# Patient Record
Sex: Female | Born: 1955 | Race: White | Hispanic: No | Smoking: Former smoker
Health system: Southern US, Community
[De-identification: ages and names within clinical notes are randomized; demographics above are authoritative.]

## PROBLEM LIST (undated history)

## (undated) DIAGNOSIS — Z8489 Family history of other specified conditions: Secondary | ICD-10-CM

## (undated) DIAGNOSIS — Z5189 Encounter for other specified aftercare: Secondary | ICD-10-CM

## (undated) DIAGNOSIS — I5181 Takotsubo syndrome: Secondary | ICD-10-CM

## (undated) DIAGNOSIS — F419 Anxiety disorder, unspecified: Secondary | ICD-10-CM

## (undated) DIAGNOSIS — G20A1 Parkinson's disease without dyskinesia, without mention of fluctuations: Secondary | ICD-10-CM

## (undated) DIAGNOSIS — E348 Other specified endocrine disorders: Secondary | ICD-10-CM

## (undated) DIAGNOSIS — R112 Nausea with vomiting, unspecified: Secondary | ICD-10-CM

## (undated) DIAGNOSIS — G20 Parkinson's disease: Secondary | ICD-10-CM

## (undated) DIAGNOSIS — K219 Gastro-esophageal reflux disease without esophagitis: Secondary | ICD-10-CM

## (undated) DIAGNOSIS — Z9889 Other specified postprocedural states: Secondary | ICD-10-CM

## (undated) DIAGNOSIS — J45909 Unspecified asthma, uncomplicated: Secondary | ICD-10-CM

## (undated) DIAGNOSIS — R0600 Dyspnea, unspecified: Secondary | ICD-10-CM

## (undated) DIAGNOSIS — R7303 Prediabetes: Secondary | ICD-10-CM

## (undated) DIAGNOSIS — M1990 Unspecified osteoarthritis, unspecified site: Secondary | ICD-10-CM

## (undated) DIAGNOSIS — T782XXA Anaphylactic shock, unspecified, initial encounter: Secondary | ICD-10-CM

## (undated) HISTORY — PX: BREAST SURGERY: SHX581

## (undated) HISTORY — PX: BUNIONECTOMY: SHX129

## (undated) HISTORY — PX: ABDOMINAL HYSTERECTOMY: SHX81

## (undated) HISTORY — PX: OTHER SURGICAL HISTORY: SHX169

## (undated) HISTORY — PX: APPENDECTOMY: SHX54

## (undated) HISTORY — PX: CHOLECYSTECTOMY: SHX55

---

## 2017-11-12 ENCOUNTER — Other Ambulatory Visit: Payer: Self-pay | Admitting: Family Medicine

## 2017-11-12 DIAGNOSIS — M25462 Effusion, left knee: Secondary | ICD-10-CM

## 2017-11-12 DIAGNOSIS — M25562 Pain in left knee: Secondary | ICD-10-CM

## 2017-11-12 DIAGNOSIS — M25662 Stiffness of left knee, not elsewhere classified: Secondary | ICD-10-CM

## 2017-11-13 ENCOUNTER — Ambulatory Visit
Admission: RE | Admit: 2017-11-13 | Discharge: 2017-11-13 | Disposition: A | Discharge status: Home / Self Care | Payer: Medicaid Other | Source: Ambulatory Visit | Attending: Family Medicine | Admitting: Family Medicine

## 2017-11-13 DIAGNOSIS — M25562 Pain in left knee: Secondary | ICD-10-CM

## 2017-11-13 DIAGNOSIS — M25462 Effusion, left knee: Secondary | ICD-10-CM

## 2017-11-13 DIAGNOSIS — M25662 Stiffness of left knee, not elsewhere classified: Secondary | ICD-10-CM

## 2017-11-23 DIAGNOSIS — E348 Other specified endocrine disorders: Secondary | ICD-10-CM | POA: Insufficient documentation

## 2017-11-23 DIAGNOSIS — R296 Repeated falls: Secondary | ICD-10-CM | POA: Insufficient documentation

## 2017-11-23 DIAGNOSIS — G20 Parkinson's disease: Secondary | ICD-10-CM | POA: Insufficient documentation

## 2017-11-23 DIAGNOSIS — E119 Type 2 diabetes mellitus without complications: Secondary | ICD-10-CM | POA: Insufficient documentation

## 2017-11-27 ENCOUNTER — Other Ambulatory Visit: Payer: Self-pay | Admitting: Neurology

## 2017-11-27 DIAGNOSIS — E348 Other specified endocrine disorders: Secondary | ICD-10-CM

## 2017-11-27 DIAGNOSIS — R296 Repeated falls: Secondary | ICD-10-CM

## 2017-11-27 DIAGNOSIS — G231 Progressive supranuclear ophthalmoplegia [Steele-Richardson-Olszewski]: Secondary | ICD-10-CM

## 2017-12-06 ENCOUNTER — Other Ambulatory Visit: Payer: Self-pay

## 2017-12-06 ENCOUNTER — Encounter: Payer: Self-pay | Admitting: Radiology

## 2017-12-06 ENCOUNTER — Inpatient Hospital Stay (HOSPITAL_COMMUNITY)
Admission: EM | Admit: 2017-12-06 | Discharge: 2017-12-08 | DRG: 287 | Disposition: A | Discharge status: Home / Self Care | Payer: Medicaid Other | Attending: Cardiology | Admitting: Cardiology

## 2017-12-06 ENCOUNTER — Ambulatory Visit
Admission: RE | Admit: 2017-12-06 | Discharge: 2017-12-06 | Disposition: A | Discharge status: Home / Self Care | Payer: Medicaid Other | Source: Ambulatory Visit | Attending: Neurology | Admitting: Neurology

## 2017-12-06 ENCOUNTER — Inpatient Hospital Stay (HOSPITAL_COMMUNITY)
Admission: EM | Disposition: A | Discharge status: Home / Self Care | Payer: Self-pay | Source: Home / Self Care | Attending: Cardiology

## 2017-12-06 DIAGNOSIS — Z87891 Personal history of nicotine dependence: Secondary | ICD-10-CM

## 2017-12-06 DIAGNOSIS — T886XXA Anaphylactic reaction due to adverse effect of correct drug or medicament properly administered, initial encounter: Secondary | ICD-10-CM | POA: Diagnosis present

## 2017-12-06 DIAGNOSIS — G231 Progressive supranuclear ophthalmoplegia [Steele-Richardson-Olszewski]: Secondary | ICD-10-CM | POA: Diagnosis present

## 2017-12-06 DIAGNOSIS — Z91040 Latex allergy status: Secondary | ICD-10-CM

## 2017-12-06 DIAGNOSIS — Z8249 Family history of ischemic heart disease and other diseases of the circulatory system: Secondary | ICD-10-CM

## 2017-12-06 DIAGNOSIS — G20 Parkinson's disease: Secondary | ICD-10-CM | POA: Diagnosis present

## 2017-12-06 DIAGNOSIS — G930 Cerebral cysts: Secondary | ICD-10-CM | POA: Diagnosis present

## 2017-12-06 DIAGNOSIS — I5181 Takotsubo syndrome: Secondary | ICD-10-CM | POA: Diagnosis not present

## 2017-12-06 DIAGNOSIS — Z9181 History of falling: Secondary | ICD-10-CM

## 2017-12-06 DIAGNOSIS — Z9049 Acquired absence of other specified parts of digestive tract: Secondary | ICD-10-CM

## 2017-12-06 DIAGNOSIS — Y838 Other surgical procedures as the cause of abnormal reaction of the patient, or of later complication, without mention of misadventure at the time of the procedure: Secondary | ICD-10-CM | POA: Diagnosis present

## 2017-12-06 DIAGNOSIS — I214 Non-ST elevation (NSTEMI) myocardial infarction: Secondary | ICD-10-CM

## 2017-12-06 DIAGNOSIS — R7989 Other specified abnormal findings of blood chemistry: Secondary | ICD-10-CM

## 2017-12-06 DIAGNOSIS — R748 Abnormal levels of other serum enzymes: Secondary | ICD-10-CM | POA: Diagnosis not present

## 2017-12-06 DIAGNOSIS — I252 Old myocardial infarction: Secondary | ICD-10-CM

## 2017-12-06 DIAGNOSIS — Z91041 Radiographic dye allergy status: Secondary | ICD-10-CM

## 2017-12-06 DIAGNOSIS — E348 Other specified endocrine disorders: Secondary | ICD-10-CM

## 2017-12-06 DIAGNOSIS — T508X5A Adverse effect of diagnostic agents, initial encounter: Secondary | ICD-10-CM | POA: Diagnosis present

## 2017-12-06 DIAGNOSIS — R778 Other specified abnormalities of plasma proteins: Secondary | ICD-10-CM

## 2017-12-06 DIAGNOSIS — R072 Precordial pain: Secondary | ICD-10-CM

## 2017-12-06 DIAGNOSIS — R296 Repeated falls: Secondary | ICD-10-CM

## 2017-12-06 DIAGNOSIS — I200 Unstable angina: Secondary | ICD-10-CM

## 2017-12-06 DIAGNOSIS — T782XXA Anaphylactic shock, unspecified, initial encounter: Secondary | ICD-10-CM

## 2017-12-06 DIAGNOSIS — K219 Gastro-esophageal reflux disease without esophagitis: Secondary | ICD-10-CM | POA: Diagnosis present

## 2017-12-06 DIAGNOSIS — R079 Chest pain, unspecified: Secondary | ICD-10-CM

## 2017-12-06 DIAGNOSIS — Z90710 Acquired absence of both cervix and uterus: Secondary | ICD-10-CM

## 2017-12-06 DIAGNOSIS — E119 Type 2 diabetes mellitus without complications: Secondary | ICD-10-CM | POA: Diagnosis present

## 2017-12-06 HISTORY — DX: Parkinson's disease without dyskinesia, without mention of fluctuations: G20.A1

## 2017-12-06 HISTORY — PX: LEFT HEART CATH AND CORONARY ANGIOGRAPHY: CATH118249

## 2017-12-06 HISTORY — DX: Anaphylactic shock, unspecified, initial encounter: T78.2XXA

## 2017-12-06 HISTORY — DX: Encounter for other specified aftercare: Z51.89

## 2017-12-06 HISTORY — DX: Other specified endocrine disorders: E34.8

## 2017-12-06 HISTORY — DX: Parkinson's disease: G20

## 2017-12-06 HISTORY — DX: Takotsubo syndrome: I51.81

## 2017-12-06 LAB — CBC WITH DIFFERENTIAL/PLATELET
Abs Immature Granulocytes: 0.1 10*3/uL (ref 0.0–0.1)
Abs Immature Granulocytes: 0 10*3/uL (ref 0.0–0.1)
BASOS ABS: 0 10*3/uL (ref 0.0–0.1)
BASOS PCT: 0 %
Basophils Absolute: 0 10*3/uL (ref 0.0–0.1)
Basophils Relative: 0 %
EOS ABS: 0 10*3/uL (ref 0.0–0.7)
EOS PCT: 0 %
Eosinophils Absolute: 0 10*3/uL (ref 0.0–0.7)
Eosinophils Relative: 0 %
HCT: 41.5 % (ref 36.0–46.0)
HCT: 41 % (ref 36.0–46.0)
Hemoglobin: 13.1 g/dL (ref 12.0–15.0)
Hemoglobin: 13.1 g/dL (ref 12.0–15.0)
Immature Granulocytes: 0 %
Immature Granulocytes: 0 %
Lymphocytes Relative: 7 %
Lymphocytes Relative: 9 %
Lymphs Abs: 0.8 10*3/uL (ref 0.7–4.0)
Lymphs Abs: 0.7 10*3/uL (ref 0.7–4.0)
MCH: 29 pg (ref 26.0–34.0)
MCH: 29 pg (ref 26.0–34.0)
MCHC: 31.6 g/dL (ref 30.0–36.0)
MCHC: 32 g/dL (ref 30.0–36.0)
MCV: 92 fL (ref 78.0–100.0)
MCV: 90.9 fL (ref 78.0–100.0)
Monocytes Absolute: 0.3 10*3/uL (ref 0.1–1.0)
Monocytes Absolute: 0.1 10*3/uL (ref 0.1–1.0)
Monocytes Relative: 3 %
Monocytes Relative: 1 %
NEUTROS ABS: 10.2 10*3/uL — AB (ref 1.7–7.7)
Neutro Abs: 7 10*3/uL (ref 1.7–7.7)
Neutrophils Relative %: 90 %
Neutrophils Relative %: 90 %
PLATELETS: 212 10*3/uL (ref 150–400)
Platelets: 228 10*3/uL (ref 150–400)
RBC: 4.51 MIL/uL (ref 3.87–5.11)
RBC: 4.51 MIL/uL (ref 3.87–5.11)
RDW: 12.8 % (ref 11.5–15.5)
RDW: 12.9 % (ref 11.5–15.5)
WBC: 11.3 10*3/uL — ABNORMAL HIGH (ref 4.0–10.5)
WBC: 7.9 10*3/uL (ref 4.0–10.5)

## 2017-12-06 LAB — BASIC METABOLIC PANEL
ANION GAP: 10 (ref 5–15)
BUN: 14 mg/dL (ref 8–23)
CALCIUM: 9.4 mg/dL (ref 8.9–10.3)
CO2: 26 mmol/L (ref 22–32)
CREATININE: 0.72 mg/dL (ref 0.44–1.00)
Chloride: 105 mmol/L (ref 98–111)
GFR calc Af Amer: >60 mL/min (ref >60)
Glucose, Bld: 174 mg/dL — ABNORMAL HIGH (ref 70–99)
POTASSIUM: 4.1 mmol/L (ref 3.5–5.1)
Sodium: 141 mmol/L (ref 135–145)

## 2017-12-06 LAB — GLUCOSE, CAPILLARY: Glucose-Capillary: 196 mg/dL — ABNORMAL HIGH (ref 70–99)

## 2017-12-06 LAB — I-STAT TROPONIN, ED: TROPONIN I, POC: 1.38 ng/mL — AB (ref 0.00–0.08)

## 2017-12-06 LAB — TSH: TSH: 0.589 u[IU]/mL (ref 0.350–4.500)

## 2017-12-06 LAB — TROPONIN I: TROPONIN I: 2.07 ng/mL — AB (ref <0.03)

## 2017-12-06 LAB — HEMOGLOBIN A1C
Hgb A1c MFr Bld: 6.6 % — ABNORMAL HIGH (ref 4.8–5.6)
Mean Plasma Glucose: 142.72 mg/dL

## 2017-12-06 LAB — PROTIME-INR
INR: 0.99
Prothrombin Time: 13 seconds (ref 11.4–15.2)

## 2017-12-06 LAB — CREATININE, SERUM
Creatinine, Ser: 0.69 mg/dL (ref 0.44–1.00)
GFR calc Af Amer: >60 mL/min
GFR calc non Af Amer: >60 mL/min

## 2017-12-06 SURGERY — LEFT HEART CATH AND CORONARY ANGIOGRAPHY
Anesthesia: LOCAL

## 2017-12-06 MED ORDER — GADOBENATE DIMEGLUMINE 529 MG/ML IV SOLN
17.0000 mL | Freq: Once | INTRAVENOUS | Status: AC | PRN
  Administered 2017-12-06: 17 mL via INTRAVENOUS

## 2017-12-06 MED ORDER — METHYLPREDNISOLONE SODIUM SUCC 125 MG IJ SOLR
125.0000 mg | Freq: Once | INTRAMUSCULAR | Status: AC
  Administered 2017-12-06: 125 mg via INTRAVENOUS
  Filled 2017-12-06: qty 2

## 2017-12-06 MED ORDER — HEPARIN (PORCINE) IN NACL 1000-0.9 UT/500ML-% IV SOLN
INTRAVENOUS | Status: AC
  Filled 2017-12-06: qty 1000

## 2017-12-06 MED ORDER — ACETAMINOPHEN 325 MG PO TABS: 650.0000 mg | ORAL_TABLET | ORAL | Status: DC | PRN

## 2017-12-06 MED ORDER — IOPAMIDOL (ISOVUE-370) INJECTION 76%
INTRAVENOUS | Status: AC
  Filled 2017-12-06: qty 100

## 2017-12-06 MED ORDER — SODIUM CHLORIDE 0.9 % IV SOLN
INTRAVENOUS | Status: AC | PRN
  Administered 2017-12-06: 10 mL/h via INTRAVENOUS

## 2017-12-06 MED ORDER — IPRATROPIUM-ALBUTEROL 0.5-2.5 (3) MG/3ML IN SOLN
RESPIRATORY_TRACT | Status: AC
  Filled 2017-12-06: qty 3

## 2017-12-06 MED ORDER — ACETAMINOPHEN 325 MG PO TABS
650.0000 mg | ORAL_TABLET | ORAL | Status: DC | PRN
  Administered 2017-12-07 – 2017-12-08 (×4): 650 mg via ORAL
  Filled 2017-12-06 (×4): qty 2

## 2017-12-06 MED ORDER — FAMOTIDINE IN NACL 20-0.9 MG/50ML-% IV SOLN
20.0000 mg | Freq: Once | INTRAVENOUS | Status: AC
  Administered 2017-12-06: 20 mg via INTRAVENOUS
  Filled 2017-12-06: qty 50

## 2017-12-06 MED ORDER — MIDAZOLAM HCL 2 MG/2ML IJ SOLN
INTRAMUSCULAR | Status: DC | PRN
  Administered 2017-12-06: 0.5 mg via INTRAVENOUS

## 2017-12-06 MED ORDER — FENTANYL CITRATE (PF) 100 MCG/2ML IJ SOLN
INTRAMUSCULAR | Status: AC
  Filled 2017-12-06: qty 2

## 2017-12-06 MED ORDER — ACETAMINOPHEN 500 MG PO TABS
1000.0000 mg | ORAL_TABLET | Freq: Once | ORAL | Status: AC
  Administered 2017-12-06: 1000 mg via ORAL
  Filled 2017-12-06: qty 2

## 2017-12-06 MED ORDER — ONDANSETRON HCL 4 MG/2ML IJ SOLN: 4.0000 mg | Freq: Four times a day (QID) | INTRAMUSCULAR | Status: DC | PRN

## 2017-12-06 MED ORDER — ASPIRIN EC 81 MG PO TBEC
81.0000 mg | DELAYED_RELEASE_TABLET | Freq: Every day | ORAL | Status: DC
  Administered 2017-12-07 – 2017-12-08 (×2): 81 mg via ORAL
  Filled 2017-12-06 (×2): qty 1

## 2017-12-06 MED ORDER — HEPARIN SODIUM (PORCINE) 5000 UNIT/ML IJ SOLN
5000.0000 [IU] | Freq: Three times a day (TID) | INTRAMUSCULAR | Status: DC
  Administered 2017-12-06 – 2017-12-08 (×5): 5000 [IU] via SUBCUTANEOUS
  Filled 2017-12-06 (×5): qty 1

## 2017-12-06 MED ORDER — FUROSEMIDE 10 MG/ML IJ SOLN
INTRAMUSCULAR | Status: DC | PRN
  Administered 2017-12-06: 20 mg via INTRAVENOUS

## 2017-12-06 MED ORDER — ASPIRIN 325 MG PO TABS: 325.0000 mg | ORAL_TABLET | Freq: Once | ORAL | Status: DC

## 2017-12-06 MED ORDER — VERAPAMIL HCL 2.5 MG/ML IV SOLN
INTRAVENOUS | Status: AC
  Filled 2017-12-06: qty 2

## 2017-12-06 MED ORDER — FUROSEMIDE 10 MG/ML IJ SOLN
INTRAMUSCULAR | Status: AC
  Filled 2017-12-06: qty 4

## 2017-12-06 MED ORDER — SODIUM CHLORIDE 0.9 % IV SOLN
INTRAVENOUS | Status: AC
  Administered 2017-12-06: 18:00:00 via INTRAVENOUS

## 2017-12-06 MED ORDER — HEPARIN SODIUM (PORCINE) 1000 UNIT/ML IJ SOLN
INTRAMUSCULAR | Status: AC
  Filled 2017-12-06: qty 1

## 2017-12-06 MED ORDER — SODIUM CHLORIDE 0.9 % IV BOLUS
500.0000 mL | Freq: Once | INTRAVENOUS | Status: AC
  Administered 2017-12-06: 11:00:00 via INTRAVENOUS

## 2017-12-06 MED ORDER — LIDOCAINE HCL (PF) 1 % IJ SOLN
INTRAMUSCULAR | Status: AC
  Filled 2017-12-06: qty 30

## 2017-12-06 MED ORDER — VERAPAMIL HCL 2.5 MG/ML IV SOLN
INTRAVENOUS | Status: DC | PRN
  Administered 2017-12-06: 10 mL via INTRA_ARTERIAL

## 2017-12-06 MED ORDER — SODIUM CHLORIDE 0.9% FLUSH
3.0000 mL | Freq: Two times a day (BID) | INTRAVENOUS | Status: DC
  Administered 2017-12-06 – 2017-12-08 (×4): 3 mL via INTRAVENOUS

## 2017-12-06 MED ORDER — IOPAMIDOL (ISOVUE-370) INJECTION 76%
INTRAVENOUS | Status: DC | PRN
  Administered 2017-12-06: 55 mL via INTRA_ARTERIAL

## 2017-12-06 MED ORDER — HEPARIN (PORCINE) IN NACL 2-0.9 UNITS/ML
INTRAMUSCULAR | Status: AC | PRN
  Administered 2017-12-06 (×2): 500 mL

## 2017-12-06 MED ORDER — SODIUM CHLORIDE 0.9% FLUSH: 3.0000 mL | INTRAVENOUS | Status: DC | PRN

## 2017-12-06 MED ORDER — LIDOCAINE HCL (PF) 1 % IJ SOLN
INTRAMUSCULAR | Status: DC | PRN
  Administered 2017-12-06: 2 mL via SUBCUTANEOUS

## 2017-12-06 MED ORDER — NITROGLYCERIN 0.4 MG SL SUBL: 0.4000 mg | SUBLINGUAL_TABLET | SUBLINGUAL | Status: DC | PRN

## 2017-12-06 MED ORDER — MIDAZOLAM HCL 2 MG/2ML IJ SOLN
INTRAMUSCULAR | Status: AC
  Filled 2017-12-06: qty 2

## 2017-12-06 MED ORDER — DIPHENHYDRAMINE HCL 50 MG/ML IJ SOLN
25.0000 mg | Freq: Once | INTRAMUSCULAR | Status: AC
  Administered 2017-12-06: 25 mg via INTRAVENOUS
  Filled 2017-12-06: qty 1

## 2017-12-06 MED ORDER — MORPHINE SULFATE (PF) 10 MG/ML IV SOLN
2.0000 mg | INTRAVENOUS | Status: DC | PRN
  Administered 2017-12-06: 2 mg via INTRAVENOUS

## 2017-12-06 MED ORDER — MORPHINE SULFATE (PF) 4 MG/ML IV SOLN
INTRAVENOUS | Status: AC
  Filled 2017-12-06: qty 1

## 2017-12-06 MED ORDER — SODIUM CHLORIDE 0.9 % IV SOLN: 250.0000 mL | INTRAVENOUS | Status: DC | PRN

## 2017-12-06 MED ORDER — FENTANYL CITRATE (PF) 100 MCG/2ML IJ SOLN
INTRAMUSCULAR | Status: DC | PRN
  Administered 2017-12-06: 25 ug via INTRAVENOUS

## 2017-12-06 MED ORDER — MORPHINE SULFATE (PF) 2 MG/ML IV SOLN
2.0000 mg | INTRAVENOUS | Status: DC | PRN
  Administered 2017-12-06 – 2017-12-07 (×7): 2 mg via INTRAVENOUS
  Filled 2017-12-06 (×7): qty 1

## 2017-12-06 SURGICAL SUPPLY — 9 items
CATH 5FR JL3.5 JR4 ANG PIG MP (CATHETERS) ×2 IMPLANT
DEVICE RAD COMP TR BAND LRG (VASCULAR PRODUCTS) ×2 IMPLANT
GLIDESHEATH SLEND A-KIT 6F 22G (SHEATH) ×2 IMPLANT
GUIDEWIRE INQWIRE 1.5J.035X260 (WIRE) ×1 IMPLANT
INQWIRE 1.5J .035X260CM (WIRE) ×2
KIT HEART LEFT (KITS) ×2 IMPLANT
PACK CARDIAC CATHETERIZATION (CUSTOM PROCEDURE TRAY) ×2 IMPLANT
TRANSDUCER W/STOPCOCK (MISCELLANEOUS) ×2 IMPLANT
TUBING CIL FLEX 10 FLL-RA (TUBING) ×2 IMPLANT

## 2017-12-06 NOTE — Progress Notes (Signed)
Magda Kiel. Young, RN placed purewick on pt. and placed to suction.

## 2017-12-06 NOTE — CV Procedure (Signed)
   Urgent left heart with coronary angiography performed after the patient developed chest pain following administration of contrast media for MRI.  Severe anterior apical and inferoapical hypokinesis/akinesis on left ventricular hand injection.  Elevated LVEDP beyond 30 mmHg.  Widely patent coronaries with 40-50% mid LAD before the first septal perforator.  LAD in the segment could be intramyocardial.  Normal RCA, left main, LAD and diagonals, and circumflex.

## 2017-12-06 NOTE — Progress Notes (Signed)
Came to room for breathing tx, per RN she already gave treatment as a stat dose.

## 2017-12-06 NOTE — ED Notes (Signed)
Radiolucent Zoll pads applied. RN with patient to cath lab at this time.

## 2017-12-06 NOTE — ED Triage Notes (Signed)
Per [facility] patient coming from doctors office after having allergic reaction occurred after receiving IV contrast. Patient began having SOB, chest pain, lip and throat swelling. Patient given 50 mg benadryl and 0.3 mg epi x2, due neb, and 324 aspirin. Chest pain is left sided and radiates to left side of neck. Hx. Of parkinson's. A/ox4.

## 2017-12-06 NOTE — Progress Notes (Signed)
Summary of events: Received patient in nursing station at [facility] site from MR 3 after patient began having an allergic reaction after Multihance/Gadolinium was injected IV.  She was sitting in her wheelchair with swollen lips and eyes, flushed face and upper body, and a wet cough (laryngeal edema).  Patient received Benadryl 50mg  IV and Epinephrine 1mg  IV before EMS arrived and took over and transported [facility].  Her blood pressures ranged from 190/117- 118/85, with her heart rate 112-153 in a sinus tach without ectopy. Normal Saline 250cc bag hung; infusing slowly through existing 22g IV catheter in right AC space. Initial SPO2 was 94-95 before O2 was administered via [place] at 4L after IV Benadryl made patient sleepy .  O2 delivery was switched to face mask since patient was mouth-breathing and had O2 sats in low 90's. Dr. Alfredo BattyMattern spoke with patient's daughter once she arrived.  She collected all of her mother's belongings and [facility].  Donell SievertJeanne Montarius Kitagawa, RN

## 2017-12-06 NOTE — ED Notes (Signed)
Pt called out, reports feeling tight in chest and felt like she was wheezing, on exam right upper lungs with wheezing. PA Ford made aware and given verbal order for x1 duoneb. Orders followed accordingly.

## 2017-12-06 NOTE — ED Notes (Signed)
Pt taking own parkinsons meds per PA

## 2017-12-06 NOTE — ED Provider Notes (Signed)
Medical screening examination/treatment/procedure(s) were conducted as a shared visit with non-physician practitioner(s) and myself.  I personally evaluated the patient during the encounter.  EKG Interpretation  Date/Time:  Thursday December 06 2017 10:50:10 EDT Ventricular Rate:  104 PR Interval:    QRS Duration: 77 QT Interval:  339 QTC Calculation: 446 R Axis:   27 Text Interpretation:  Sinus tachycardia Low voltage, precordial leads Abnormal R-wave progression, early transition No previous ECGs available Confirmed by Vanetta Mulders [phone]) on 12/06/2017 11:03:09 AM  Results for orders placed or performed during the hospital encounter of 12/06/17  Basic metabolic panel  Result Value Ref Range   Sodium 141 135 - 145 mmol/L   Potassium 4.1 3.5 - 5.1 mmol/L   Chloride 105 98 - 111 mmol/L   CO2 26 22 - 32 mmol/L   Glucose, Bld 174 (H) 70 - 99 mg/dL   BUN 14 8 - 23 mg/dL   Creatinine, Ser 9.14 0.44 - 1.00 mg/dL   Calcium 9.4 8.9 - 78.2 mg/dL   GFR calc non Af Amer >60 >60 mL/min   GFR calc Af Amer >60 >60 mL/min   Anion gap 10 5 - 15  CBC with Differential  Result Value Ref Range   WBC 11.3 (H) 4.0 - 10.5 K/uL   RBC 4.51 3.87 - 5.11 MIL/uL   Hemoglobin 13.1 12.0 - 15.0 g/dL   HCT 95.6 21.3 - 08.6 %   MCV 92.0 78.0 - 100.0 fL   MCH 29.0 26.0 - 34.0 pg   MCHC 31.6 30.0 - 36.0 g/dL   RDW 57.8 46.9 - 62.9 %   Platelets 212 150 - 400 K/uL   Neutrophils Relative % 90 %   Neutro Abs 10.2 (H) 1.7 - 7.7 K/uL   Lymphocytes Relative 7 %   Lymphs Abs 0.8 0.7 - 4.0 K/uL   Monocytes Relative 3 %   Monocytes Absolute 0.3 0.1 - 1.0 K/uL   Eosinophils Relative 0 %   Eosinophils Absolute 0.0 0.0 - 0.7 K/uL   Basophils Relative 0 %   Basophils Absolute 0.0 0.0 - 0.1 K/uL   Immature Granulocytes 0 %   Abs Immature Granulocytes 0.1 0.0 - 0.1 K/uL  I-stat troponin, ED  Result Value Ref Range   Troponin i, poc 1.38 (HH) 0.00 - 0.08 ng/mL   Comment NOTIFIED PHYSICIAN    Comment 3              Patient seen by me along with the physician assistant.  Patient was sent over from the [facility] imaging center which we initially thought was for an allergic reaction to contrast dye.  And that was part of it.  Resulted in as if she was wheezy and also some lip swelling.  However due to the severity of the reaction nare patient did receive epinephrine.  We were notified by radiology that the dose of epinephrine was not of an EpiPen.  Patient received 1 mg of epinephrine x2.  Then went on to develop some chest pain.  This obviously is of some concern.  Patient's troponin here is elevated EKG without any acute cardiac changes nothing consistent for STEMI.  However based on this story patient's more than just an allergic reaction which we could have observed and if she improved  Send her home feel that would obviously the elevated troponin the chest pain which may be post cardiac stress related does warrant admission and serial troponins.  Currently patient stable oxygen saturation on oxygen are in  the upper 90s patient feels more comfortable on oxygen still a little tachycardic with heart rate of 103.  Blood pressures fine no hypotension.  Patient able to talk okay lips are still swollen patient feels tongue is not swollen patient handling her upper airway secretions fine.  Patient does not need intubation at this time.  Lungs are clear bilaterally no wheezing.   CRITICAL CARE Performed by: Vanetta MuldersZACKOWSKI,Jermiah Howton Total critical care time: 30 minutes Critical care time was exclusive of separately billable procedures and treating other patients. Critical care was necessary to treat or prevent imminent or life-threatening deterioration. Critical care was time spent personally by me on the following activities: development of treatment plan with patient and/or surrogate as well as nursing, discussions with consultants, evaluation of patient's response to treatment, examination of patient, obtaining  history from patient or surrogate, ordering and performing treatments and interventions, ordering and review of laboratory studies, ordering and review of radiographic studies, pulse oximetry and re-evaluation of patient's condition.    Vanetta MuldersZackowski, Margrit Minner, MD 12/06/17 [phone]

## 2017-12-06 NOTE — H&P (Addendum)
History and Physical:   Patient IDFanny Bien: Syble Tenpenny; [id number]; 02/18/1956   Admit date: 12/06/2017 Date of Consult: 12/06/2017  Primary Care Provider: Leilani Ableeese, Betti, MD Primary Cardiologist: New-Dr. Jens Somrenshaw CC: Anaphylaxis reaction   Patient Profile:   Fanny BienSusanne Barefoot is a 62 y.o. female with a hx of DM2, Parkinson's and pineal gland cyst who is being seen today for the evaluation of elevated troponin at the request of Dr. Ala DachFord.  History of Present Illness:   Ms. Azucena CecilBuffkin is a 62yo F with a hx as stated above who [facility] after having an anaphylaxis allergic reaction to the gadolinium contrast dye while getting an outpatient MRI at [facility] earlier today to further evaluate the pineal cyst. Pt states that shortly after dye injection she began to experience lip and facial swelling  with complaints of throat closing and felling as if a "balloon" was in her throat. Per chart review, the facility did not have EPI Pen dosing for epinephrine injection. It seems that she was given 1mg  doses of epinephrine, far more than one would typically receive in an uncomplicated anaphylaxis rescue. She then began to experience anterior chest pain with radiation to her left shoulder. EMS was called and she was transported to the ED for further evaluation.   In the ED, she was given 50 mg of benadryl and an additional 0.3 mg of Epinephrine, as well as Duoneb for wheezing and 324 ASA. Her chest pain, described as left sided chest pain with radiation to her left neck, has persisted since her arrival. She states that the pain in continual, described as sharp and stabbing rated a 7/10 in severity. An EKG revealed sinus tachycardia with a HR of 104 bpm with low voltage QRS and baseline wonder. There is no evidence of acute ischemia with ST-T wave abnormalities. Given her persistent symptoms, a troponin was drawn which was elevated at 1.38.   She has no prior hx of CAD. She has a hx of  smoking, quit in 2000. She drinks alcohol only on occasion. She has a family hx of CHF in her mother and father, but no hx of CAD. Daughter is at the bedside who reports patient just recently moved here from [place] to become established with [facility] for better management of her Parkinson's and was referred for the imaging for better evaluation of the pineal cyst. She reports baseline SOB and some recent LE swelling. She walks with a cane and walker secondary to her Parkinson's. She denies orthopnea, recent illness, N/V, diaphoresis, or syncope. Prior to this event, she states that she was in her usual state of health.   Cardiology has been asked to see and admit the patient secondary to markedly elevated troponin level.   Past Medical History:  Diagnosis Date  . Blood transfusion without reported diagnosis   . Parkinson's disease [facility](HCC)     Past Surgical History:  Procedure Laterality Date  . ABDOMINAL HYSTERECTOMY    . APPENDECTOMY    . BREAST SURGERY    . CHOLECYSTECTOMY       Prior to Admission medications   Not on File    Inpatient Medications: Scheduled Meds: . acetaminophen  1,000 mg Oral Once  . aspirin  325 mg Oral Once  . ipratropium-albuterol       Continuous Infusions:  PRN Meds:   Allergies:    Allergies  Allergen Reactions  . Gadolinium Derivatives Anaphylaxis    12/06/17 After receiving Multihance: severe lip  swelling, redness, laryngeal edema; to ED via EMS  . Latex Anaphylaxis    Social History:   Social History   Socioeconomic History  . Marital status: Divorced    Spouse name: Not on file  . Number of children: Not on file  . Years of education: Not on file  . Highest education level: Not on file  Occupational History  . Not on file  Social Needs  . Financial resource strain: Not on file  . Food insecurity:    Worry: Not on file    Inability: Not on file  . Transportation needs:    Medical: Not on file    Non-medical: Not on  file  Tobacco Use  . Smoking status: Never Smoker  . Smokeless tobacco: Never Used  Substance and Sexual Activity  . Alcohol use: Yes    Comment: occasionally   . Drug use: Never  . Sexual activity: Not on file  Lifestyle  . Physical activity:    Days per week: Not on file    Minutes per session: Not on file  . Stress: Not on file  Relationships  . Social connections:    Talks on phone: Not on file    Gets together: Not on file    Attends religious service: Not on file    Active member of club or organization: Not on file    Attends meetings of clubs or organizations: Not on file    Relationship status: Not on file  . Intimate partner violence:    Fear of current or ex partner: Not on file    Emotionally abused: Not on file    Physically abused: Not on file    Forced sexual activity: Not on file  Other Topics Concern  . Not on file  Social History Narrative  . Not on file    Family History:   No family history on file. Family Status:  No family status information on file.    ROS:  Please see the history of present illness.  All other ROS reviewed and negative.     Physical Exam/Data:   Vitals:   12/06/17 1330 12/06/17 1345 12/06/17 1400 12/06/17 1415  BP: 110/73 113/82 107/74 (!) 118/97  Pulse: (!) 108 (!) 107 (!) 105 (!) 105  Resp: 15 (!) 21 15 (!) 22  Temp:      TempSrc:      SpO2: 97% 96% 98% 97%  Weight:      Height:       No intake or output data in the 24 hours ending 12/06/17 1428 Filed Weights   12/06/17 1049  Weight: 185 lb (83.9 kg)   Body mass index is 28.13 kg/m.   General: Obese, NAD Skin: Warm, dry, intact. There is evidence of facial swelling.  Head: Normocephalic, atraumatic, clear, moist mucus membranes. Neck: Negative for carotid bruits. No JVD Lungs: Bilateral upper lobe wheezing.  No rales or rhonchi. Breathing is unlabored on [place]  Cardiovascular: RRR with S1 S2. No murmurs, rubs or gallops Abdomen: Soft, non-tender,  non-distended with normoactive bowel sounds.  No obvious abdominal masses. MSK: Strength and tone appear normal for age. 5/5 in all extremities Extremities: Mild 1+ LE edema. No clubbing or cyanosis. DP/PT pulses 2+ bilaterally Neuro: Alert and oriented. No focal deficits. No facial asymmetry. MAE spontaneously. Psych: Responds to questions appropriately with normal affect.     EKG:  The EKG was personally reviewed and demonstrates:  12/06/17 ST with no acute ischemic changes  Telemetry:  Telemetry was personally reviewed and demonstrates: 12/06/17 ST HR 104  Relevant CV Studies:  ECHO: None   CATH: None   Laboratory Data:  ChemistryNo results for input(s): NA, K, CL, CO2, GLUCOSE, BUN, CREATININE, CALCIUM, GFRNONAA, GFRAA, ANIONGAP in the last 168 hours.  No results found for: PROT, ALBUMIN, AST, ALT, ALKPHOS, BILITOT Hematology Recent Labs  Lab 12/06/17 1350  WBC 11.3*  RBC 4.51  HGB 13.1  HCT 41.5  MCV 92.0  MCH 29.0  MCHC 31.6  RDW 12.8  PLT 212   Cardiac EnzymesNo results for input(s): TROPONINI in the last 168 hours.  Recent Labs  Lab 12/06/17 1356  TROPIPOC 1.38*    BNPNo results for input(s): BNP, PROBNP in the last 168 hours.  DDimer No results for input(s): DDIMER in the last 168 hours. TSH: No results found for: TSH Lipids:No results found for: CHOL, HDL, LDLCALC, LDLDIRECT, TRIG, CHOLHDL HgbA1c:No results found for: HGBA1C  Radiology/Studies:  Mr Laqueta Jean ZO Contrast  Result Date: 12/06/2017 CLINICAL DATA:  Progressive supranuclear palsy. Frequent falls. Worsening vision. EXAM: MRI HEAD WITHOUT AND WITH CONTRAST TECHNIQUE: Multiplanar, multiecho pulse sequences of the brain and surrounding structures were obtained without and with intravenous contrast. CONTRAST:  17mL MULTIHANCE GADOBENATE DIMEGLUMINE 529 MG/ML IV SOLN Creatinine was obtained on site at [facility] at [address] Results: Creatinine 0.6 mg/dL. COMPARISON:  None. FINDINGS:  The patient developed an allergic reaction immediately after injection of MultiHance, initially characterized by sneezing and lips swelling, subsequent difficulty breathing, redness, and mildly decreased level of consciousness. Benadryl and epinephrine were given. Patient was deemed unstable, and ambulance transfer from the outpatient setting to hospital emergency department was initiated. For this reason, no post infusion images could be obtained. Brain: Diminished midbrain volume is more evident on the axial images (series 15, image 68, Mickey Mouse sign) than on the sagittal T1 weighted imaging (series 8, image 16, Hummingbird sign). No evidence for diffuse T2 hyperintensity in the pontine tegmentum, or the olivary nuclei, although mild midbrain tectal hyperintensity may be present. There is advanced cerebellar vermian and cerebellar hemispheric atrophy. Mild premature for age cerebral atrophy. Basal ganglia calcification is prominent. No cortical infarct, hemorrhage, mass lesion, hydrocephalus, or extra-axial fluid. 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 No aqueductal compression. Vascular: Flow voids are maintained. Skull and upper cervical spine: Normal marrow signal. Sinuses/Orbits: Negative. Other: None. IMPRESSION: Constellation of findings with diminished midbrain volume relative to normal pons, consistent with the clinical diagnosis of progressive supranuclear palsy. No acute intracranial findings. 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 No aqueductal obstruction. Electronically Signed   By: Elsie Stain M.D.   On: 12/06/2017 13:42   Assessment and Plan:   1. Anaphylaxis secondary to contrast dye with chest pain and elevated troponin: -Pt was at [facility] on 12/06/17 undergoing an MRI to further evaluate a known pineal brain cyst per [facility] neurology. After contrast dye injection, the patient had  anaphylaxis reaction and was given Epinephrine at the facility. EMS was called for transport. She began to experience mid-sternal chest pain with radiation to her left shoulder, described as sharp/shooting pain. Given that this has persisted, a troponin level was drawn which came back markedly elevated at 1.38. -She continues to have 7/10 chest discomfort  -EKG with no acute ischemic changes -Trop, 1.38>>>continue to cycle enzymes  -Bedside echocardiogram per MD  with reports of decreased wall motion>>will obtain formal echocardiogram study -Plan for cardiac catheterization for further ischemic evaluation  -Will need to be pre-medicated for procedure -The risks and benefits of a cardiac catheterization including, but not limited to, death, stroke, MI, kidney damage and bleeding were discussed with the patient who indicates understanding and agrees to proceed.  -Creatinine stable at 0.72  2. Pineal cyst: -Pt recently moved to [place] in March 2019 with a known hx of Parkinson's and a pineal brain cyst. Pt was recently seen and established with [facility] who referred her for imaging of the cyst. -See above   3. DM2: -Pt reports HbA1c, 6.1 -Will check A1C, lipid panel  -SSI for glucose control while inpatient   4. Hx of Parkinson's: -Followed now by [facility]   For questions or updates, please contact [facility] Please consult [email or web address] for contact info under Cardiology/STEMI.   Signed, Georgie Chard NP-C [facility] Pager: [phone] 12/06/2017 2:28 PM  As above, patient seen and examined.  Briefly she is a 62 year old female with past medical history of diet-controlled diabetes mellitus, progressive supranuclear palsy, gastroesophageal reflux disease with non-ST elevation myocardial infarction.  No prior cardiac history.  Patient was having an MRI today and after being given gadolinium developed dyspnea, swollen lips.  She received Benadryl 50 mg IV and epinephrine 1 mg IV  and was transported to the emergency room.  She began having chest pain that was described as sharp and stabbing radiating to her left neck and shoulder.  The pain is not pleuritic or positional.  It is persistent at the time of this evaluation.  Troponin is 1.38. Electrocardiogram shows sinus tachycardia, low voltage and nonspecific ST changes.  Quick look bedside echocardiogram shows large anteroseptal and apical akinesis.  1 non-ST elevation myocardial infarction-patient had an allergic reaction to gadolinium which was treated with epinephrine.  She then developed chest pain and her troponin is abnormal with persistent symptoms.  Quick look echocardiogram shows a large anterior, septal and apical wall motion abnormality possibly consistent with Takotsubo cardiomyopathy.  However given persistent symptoms, abnormal troponin and wall motion abnormality I can also not exclude an acute coronary syndrome with occluded LAD.  Plan to proceed with urgent cardiac catheterization.  The risks and benefits including myocardial infarction, CVA and death discussed and she agrees to proceed.  Note she was treated with Solu-Medrol in the emergency room for her reaction to gadolinium.  She has no history of allergy to iodine-based contrast but regardless Solu-Medrol will be in her system.  We will repeat echocardiogram tomorrow morning.  2 history of diabetes mellitus diet controlled-follow CBGs.  3 allergic reaction to gadolinium-she has received Benadryl, Solu-Medrol and epinephrine.  We will follow.  4 progressive supranuclear palsy  Olga Millers, MD

## 2017-12-06 NOTE — ED Notes (Signed)
Placed purewick on pt. 

## 2017-12-06 NOTE — Interval H&P Note (Signed)
Cath Lab Visit (complete for each Cath Lab visit)  Clinical Evaluation Leading to the Procedure:   ACS: Yes.    Non-ACS:    Anginal Classification: CCS III  Anti-ischemic medical therapy: No Therapy  Non-Invasive Test Results: No non-invasive testing performed  Prior CABG: No previous CABG      History and Physical Interval Note:  12/06/2017 4:12 PM  Sabrina Crawford  has presented today for surgery, with the diagnosis of urgent - cp  The various methods of treatment have been discussed with the patient and family. After consideration of risks, benefits and other options for treatment, the patient has consented to  Procedure(s): LEFT HEART CATH AND CORONARY ANGIOGRAPHY (N/A) as a surgical intervention .  The patient's history has been reviewed, patient examined, no change in status, stable for surgery.  I have reviewed the patient's chart and labs.  Questions were answered to the patient's satisfaction.     Lyn RecordsHenry W Yeudiel Mateo III

## 2017-12-06 NOTE — ED Provider Notes (Signed)
[facility] EMERGENCY DEPARTMENT Provider Note   CSN: [id number] Arrival date & time: 12/06/17  1039     History   Chief Complaint Chief Complaint  Patient presents with  . Allergic Reaction    HPI Sabrina Crawford is a 62 y.o. female.   Sabrina Crawford is a 62 y.o. Female with a history of diabetes, Parkinson's and pineal gland cyst, who presents to the emergency department via EMS for evaluation of anaphylactic reaction.  Patient was at an outpatient imaging center having a MRI with contrast done, and had an allergic reaction to the gadolinium contrast.  Per EMS immediately after receiving contrast she started to have facial and lip swelling and complained of throat closing sensation and became hypotensive, patient received 1 dose of epinephrine prior to EMS arrival and a second dose with EMS as well as 50 mg of Benadryl, patient also is received about 450 mL IV fluids.  On arrival to the ED patient reports that her breathing is improving, normal O2 saturations on room air and lungs are clear per EMS.  Patient also received a DuoNeb.  She reports she feels her lip and facial swelling has improved and she is breathing more easily.  Patient complaining of some lateral left neck and shoulder pain since all this started, which is persistent but not worsening.  She denies any feelings of dizziness.  No abdominal pain, nausea or vomiting, no chest pain or shortness of breath at this time.  Patient denies history of anaphylaxis in the past.  Daughter is at the bedside who reports patient just recently moved here from [place] to become established with [facility] neurology clinic for better management of her Parkinson's.     Past Medical History:  Diagnosis Date  . Blood transfusion without reported diagnosis   . Parkinson's disease [facility])     Patient Active Problem List   Diagnosis Date Noted  . Controlled type 2 diabetes mellitus without complication, without long-term current  use of insulin (HCC) 11/23/2017  . Falls frequently 11/23/2017  . 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 ABDOMINAL HYSTERECTOMY    . APPENDECTOMY    . BREAST SURGERY    . CHOLECYSTECTOMY       OB History   None      Home Medications    Prior to Admission medications   Not on File    Family History No family history on file.  Social History Social History   Tobacco Use  . Smoking status: Never Smoker  . Smokeless tobacco: Never Used  Substance Use Topics  . Alcohol use: Yes    Comment: occasionally   . Drug use: Never     Allergies   Gadolinium derivatives and Latex   Review of Systems Review of Systems  Constitutional: Negative for chills and fever.  HENT: Positive for facial swelling. Negative for drooling, sore throat and trouble swallowing.   Eyes: Negative for visual disturbance.  Respiratory: Negative for cough, shortness of breath and stridor.   Cardiovascular: Negative for chest pain, palpitations and leg swelling.  Gastrointestinal: Negative for abdominal pain, nausea and vomiting.  Genitourinary: Negative for dysuria and frequency.  Musculoskeletal: Positive for myalgias and neck pain. Negative for arthralgias.  Skin: Negative for color change and rash.  Neurological: Positive for light-headedness. Negative for syncope.     Physical Exam Updated Vital Signs BP (!) 115/91   Pulse Marland Kitchen)  115   Temp 98.2 F (36.8 C) (Oral)   Resp 16   Ht 5\' 8"  (1.727 m)   Wt 83.9 kg (185 lb)   SpO2 96%   BMI 28.13 kg/m    Physical Exam  Constitutional: She is oriented to person, place, and time. She appears well-developed and well-nourished. No distress.  HENT:  Head: Normocephalic and atraumatic.  Perioral and lip swelling, no swelling of the tongue, posterior oropharynx is clear  Eyes: Pupils are equal, round, and reactive to light. EOM are normal. Right  eye exhibits no discharge. Left eye exhibits no discharge.  Neck: Normal range of motion. Neck supple.  No stridor  Cardiovascular: Regular rhythm, normal heart sounds and intact distal pulses.  Mild tachycardia  Pulmonary/Chest: Effort normal and breath sounds normal. No stridor. No respiratory distress. She has no wheezes. She has no rales.  Respirations equal and unlabored, patient able to speak in full sentences, lungs clear to auscultation bilaterally  Abdominal: Soft. Bowel sounds are normal. She exhibits no distension and no mass. There is no tenderness. There is no guarding.  Musculoskeletal: She exhibits no edema or deformity.  Neurological: She is alert and oriented to person, place, and time. Coordination normal.  Speech is clear, able to follow commands CN III-XII intact Normal strength in upper and lower extremities bilaterally including dorsiflexion and plantar flexion, strong and equal grip strength Sensation normal to light and sharp touch Moves extremities without ataxia, coordination intact  Skin: Skin is warm and dry. Capillary refill takes less than 2 seconds. She is not diaphoretic.  Psychiatric: She has a normal mood and affect. Her behavior is normal.  Nursing note and vitals reviewed.    ED Treatments / Results  Labs (all labs ordered are listed, but only abnormal results are displayed) Labs Reviewed  BASIC METABOLIC PANEL - Abnormal; Notable for the following components:      Result Value   Glucose, Bld 174 (*)    All other components within normal limits  CBC WITH DIFFERENTIAL/PLATELET - Abnormal; Notable for the following components:   WBC 11.3 (*)    Neutro Abs 10.2 (*)    All other components within normal limits  I-STAT TROPONIN, ED - Abnormal; Notable for the following components:   Troponin i, poc 1.38 (*)    All other components within normal limits    EKG EKG Interpretation  Date/Time:  Thursday December 06 2017 10:50:10 EDT Ventricular Rate:    104 PR Interval:    QRS Duration: 77 QT Interval:  339 QTC Calculation: 446 R Axis:   27 Text Interpretation:  Sinus tachycardia Low voltage, precordial leads Abnormal R-wave progression, early transition No previous ECGs available Confirmed by Vanetta Mulders [phone]) on 12/06/2017 11:03:09 AM   Radiology Mr Brain W Wo Contrast  Result Date: 12/06/2017 CLINICAL DATA:  Progressive supranuclear palsy. Frequent falls. Worsening vision. EXAM: MRI HEAD WITHOUT AND WITH CONTRAST TECHNIQUE: Multiplanar, multiecho pulse sequences of the brain and surrounding structures were obtained without and with intravenous contrast. CONTRAST:  17mL MULTIHANCE GADOBENATE DIMEGLUMINE 529 MG/ML IV SOLN Creatinine was obtained on site at [facility] at [address] Results: Creatinine 0.6 mg/dL. COMPARISON:  None. FINDINGS: The patient developed an allergic reaction immediately after injection of MultiHance, initially characterized by sneezing and lips swelling, subsequent difficulty breathing, redness, and mildly decreased level of consciousness. Benadryl and epinephrine were given. Patient was deemed unstable, and ambulance transfer from the outpatient setting to hospital emergency department was initiated. For  this reason, no post infusion images could be obtained. Brain: Diminished midbrain volume is more evident on the axial images (series 15, image 68, Mickey Mouse sign) than on the sagittal T1 weighted imaging (series 8, image 16, Hummingbird sign). No evidence for diffuse T2 hyperintensity in the pontine tegmentum, or the olivary nuclei, although mild midbrain tectal hyperintensity may be present. There is advanced cerebellar vermian and cerebellar hemispheric atrophy. Mild premature for age cerebral atrophy. Basal ganglia calcification is prominent. No cortical infarct, hemorrhage, mass lesion, hydrocephalus, or extra-axial fluid. 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 No  aqueductal compression. Vascular: Flow voids are maintained. Skull and upper cervical spine: Normal marrow signal. Sinuses/Orbits: Negative. Other: None. IMPRESSION: Constellation of findings with diminished midbrain volume relative to normal pons, consistent with the clinical diagnosis of progressive supranuclear palsy. No acute intracranial findings. 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 No aqueductal obstruction. Electronically Signed   By: Elsie Stain M.D.   On: 12/06/2017 13:42    Procedures Procedures (including critical care time)  Medications Ordered in ED Medications  sodium chloride 0.9 % bolus 500 mL ( Intravenous New Bag/Given 12/06/17 1125)  methylPREDNISolone sodium succinate (SOLU-MEDROL) 125 mg/2 mL injection 125 mg (125 mg Intravenous Given 12/06/17 1122)  famotidine (PEPCID) IVPB 20 mg premix (20 mg Intravenous New Bag/Given 12/06/17 1126)     Initial Impression / Assessment and Plan / ED Course  I have reviewed the triage vital signs and the nursing notes.  Pertinent labs & imaging results that were available during my care of the patient were reviewed by me and considered in my medical decision making (see chart for details).  Patient presents to the ED for evaluation of allergic reaction to gadolinium contrast, received 2 doses of epinephrine as well as 50 mg of Benadryl prior to arrival now with improving symptoms.  Patient appears to be in no respiratory distress with clear lungs and no stridor breathing on room air with 100% O2 saturation.  Perioral and lip swelling, but posterior oropharynx is clear.  Swelling reported as improving by patient and EMS.  Will give patient IV Solu-Medrol, Pepcid and 500 mL of additional fluid and monitor for the next 4 hours for any recurrence or worsening of reaction. Patient denies chest pain or shortness of breath at this time.  1:27 PM received call from Dr. Gennette Pac with radiology who was involved in the patient's treatment at the outpatient imaging facility, he wanted to ensure we were aware that they did not have EpiPen's at the facility so patient received two 1 mg doses of epinephrine, and afterwards was complaining of some chest discomfort, given this information and that patient received far more epinephrine than one typically would in uncomplicated anaphylaxis, 2.3 mg in total, will get basic labs and troponin. EKG without ischemic changes.  Initially thought this patient will be a candidate for observation and potential discharge home with improvement, but given high doses of epi received by patient feel at a minimum she will need observational admission with cycle troponins.  After speaking more with the patient she does report she is having some mild left-sided chest pain.  Troponin 1.38, labs otherwise unremarkable, mild leukocytosis of 11.3, normal hemoglobin, glucose of 174 but no other acute electrolyte derangements, creatinine normal.  Cardiology consulted regarding positive troponin and will come and evaluate the patient.  Patient is mildly  tachycardic but vitals otherwise remained stable.  Patient comfortable on 2 L nasal cannula, received 324 of aspirin with EMS.  Cardiology at bedside to evaluate patient, bedside echo revealed wall motion abnormality, patient will be admitted to the cardiology service and taken to the Cath Lab  Patient discussed with Dr. Deretha EmoryZackowski, who saw patient as well and agrees with plan.   Final Clinical Impressions(s) / ED Diagnoses   Final diagnoses:  Anaphylaxis, initial encounter  Precordial pain  Elevated troponin    ED Discharge Orders    None      Dartha LodgeFord, Doyne Ellinger N, [place] JerseyPA-C 12/06/17 1557  Vanetta MuldersZackowski, Scott, MD 12/10/17 [phone]

## 2017-12-06 NOTE — Progress Notes (Signed)
I was called to see the patient in MRI at 9:35 am.  After receiving a standard dose of 17 ml Multihance (Gadolinium), the patient experienced increased secretions and began having difficulty breathing.  She was sitting upright when I arrived at the MRI scanner.  She was provided her own albuterol inhaler which she used without significant improvement in her breathing.  At that time she had swelling of her lips and erythema of the face.  I then had her transferred to the nursing station.  Please see nursing note from the same day for additional details of the encounter.  We gave her 50mg  Benedryl.  1mg  of epinephrine was prepared in a 1:10:000 solution for IV push.  The entire 1mg  was then given.  No additional epinephrine or other IV medications were administered.    The patient was simultaneously placed on a monitor.  She was tachycardic and hypertensive as detailed earlier.  She given supplemental oxygen via facemask and maintained good oxygenation of 94%+.    She began to complain of chest pain extending to the left shoulder.  A single lead tracing demonstrated no ST depression.  We did not have access to a 12 lead EKG.  We did hang a 250ml bag of normal saline with a slow infusion.  The patients facial edema and erythema had resolved, but she continued to complain of difficulty breathing and chest pain.    We therefore [facility] for [facility] evaluation and treatment.  I called Dr. Vanetta MuldersScott Zackowski after [facility] to review these events.

## 2017-12-07 ENCOUNTER — Observation Stay (HOSPITAL_BASED_OUTPATIENT_CLINIC_OR_DEPARTMENT_OTHER): Payer: Medicaid Other

## 2017-12-07 ENCOUNTER — Encounter (HOSPITAL_COMMUNITY): Payer: Self-pay | Admitting: Interventional Cardiology

## 2017-12-07 DIAGNOSIS — Z91041 Radiographic dye allergy status: Secondary | ICD-10-CM | POA: Diagnosis not present

## 2017-12-07 DIAGNOSIS — I5181 Takotsubo syndrome: Secondary | ICD-10-CM | POA: Diagnosis present

## 2017-12-07 DIAGNOSIS — T886XXA Anaphylactic reaction due to adverse effect of correct drug or medicament properly administered, initial encounter: Secondary | ICD-10-CM | POA: Diagnosis present

## 2017-12-07 DIAGNOSIS — Z90710 Acquired absence of both cervix and uterus: Secondary | ICD-10-CM | POA: Diagnosis not present

## 2017-12-07 DIAGNOSIS — G231 Progressive supranuclear ophthalmoplegia [Steele-Richardson-Olszewski]: Secondary | ICD-10-CM | POA: Diagnosis present

## 2017-12-07 DIAGNOSIS — R079 Chest pain, unspecified: Secondary | ICD-10-CM

## 2017-12-07 DIAGNOSIS — G20 Parkinson's disease: Secondary | ICD-10-CM | POA: Diagnosis present

## 2017-12-07 DIAGNOSIS — R748 Abnormal levels of other serum enzymes: Secondary | ICD-10-CM

## 2017-12-07 DIAGNOSIS — E119 Type 2 diabetes mellitus without complications: Secondary | ICD-10-CM | POA: Diagnosis present

## 2017-12-07 DIAGNOSIS — Y838 Other surgical procedures as the cause of abnormal reaction of the patient, or of later complication, without mention of misadventure at the time of the procedure: Secondary | ICD-10-CM | POA: Diagnosis present

## 2017-12-07 DIAGNOSIS — K219 Gastro-esophageal reflux disease without esophagitis: Secondary | ICD-10-CM | POA: Diagnosis present

## 2017-12-07 DIAGNOSIS — R072 Precordial pain: Secondary | ICD-10-CM | POA: Diagnosis present

## 2017-12-07 DIAGNOSIS — I252 Old myocardial infarction: Secondary | ICD-10-CM | POA: Diagnosis not present

## 2017-12-07 DIAGNOSIS — G930 Cerebral cysts: Secondary | ICD-10-CM | POA: Diagnosis present

## 2017-12-07 DIAGNOSIS — T508X5A Adverse effect of diagnostic agents, initial encounter: Secondary | ICD-10-CM | POA: Diagnosis present

## 2017-12-07 DIAGNOSIS — Z8249 Family history of ischemic heart disease and other diseases of the circulatory system: Secondary | ICD-10-CM | POA: Diagnosis not present

## 2017-12-07 DIAGNOSIS — Z91040 Latex allergy status: Secondary | ICD-10-CM | POA: Diagnosis not present

## 2017-12-07 DIAGNOSIS — Z87891 Personal history of nicotine dependence: Secondary | ICD-10-CM | POA: Diagnosis not present

## 2017-12-07 DIAGNOSIS — Z9181 History of falling: Secondary | ICD-10-CM | POA: Diagnosis not present

## 2017-12-07 DIAGNOSIS — Z9049 Acquired absence of other specified parts of digestive tract: Secondary | ICD-10-CM | POA: Diagnosis not present

## 2017-12-07 LAB — CBC
HEMATOCRIT: 38.6 % (ref 36.0–46.0)
Hemoglobin: 12.2 g/dL (ref 12.0–15.0)
MCH: 28.9 pg (ref 26.0–34.0)
MCHC: 31.6 g/dL (ref 30.0–36.0)
MCV: 91.5 fL (ref 78.0–100.0)
Platelets: 200 10*3/uL (ref 150–400)
RBC: 4.22 MIL/uL (ref 3.87–5.11)
RDW: 13.2 % (ref 11.5–15.5)
WBC: 12.5 10*3/uL — ABNORMAL HIGH (ref 4.0–10.5)

## 2017-12-07 LAB — BASIC METABOLIC PANEL
Anion gap: 9 (ref 5–15)
BUN: 21 mg/dL (ref 8–23)
CO2: 24 mmol/L (ref 22–32)
Calcium: 9.1 mg/dL (ref 8.9–10.3)
Chloride: 105 mmol/L (ref 98–111)
Creatinine, Ser: 0.58 mg/dL (ref 0.44–1.00)
GFR calc Af Amer: >60 mL/min (ref >60)
GLUCOSE: 116 mg/dL — AB (ref 70–99)
POTASSIUM: 4 mmol/L (ref 3.5–5.1)
Sodium: 138 mmol/L (ref 135–145)

## 2017-12-07 LAB — ECHOCARDIOGRAM COMPLETE
HEIGHTINCHES: 68 in
WEIGHTICAEL: 2966.51 [oz_av]

## 2017-12-07 LAB — TROPONIN I: TROPONIN I: 3.07 ng/mL — AB (ref <0.03)

## 2017-12-07 LAB — HIV ANTIBODY (ROUTINE TESTING W REFLEX): HIV Screen 4th Generation wRfx: NONREACTIVE

## 2017-12-07 LAB — MRSA PCR SCREENING: MRSA by PCR: NEGATIVE

## 2017-12-07 MED ORDER — IPRATROPIUM-ALBUTEROL 0.5-2.5 (3) MG/3ML IN SOLN
3.0000 mL | RESPIRATORY_TRACT | Status: DC | PRN
  Administered 2017-12-07 (×2): 3 mL via RESPIRATORY_TRACT
  Filled 2017-12-07 (×2): qty 3

## 2017-12-07 MED ORDER — PANTOPRAZOLE SODIUM 40 MG PO TBEC
80.0000 mg | DELAYED_RELEASE_TABLET | Freq: Every day | ORAL | Status: DC
  Administered 2017-12-07 – 2017-12-08 (×2): 80 mg via ORAL
  Filled 2017-12-07 (×2): qty 2

## 2017-12-07 MED ORDER — VENLAFAXINE HCL ER 75 MG PO CP24
75.0000 mg | ORAL_CAPSULE | Freq: Every day | ORAL | Status: DC
  Administered 2017-12-07 – 2017-12-08 (×2): 75 mg via ORAL
  Filled 2017-12-07 (×2): qty 1

## 2017-12-07 MED ORDER — ATORVASTATIN CALCIUM 20 MG PO TABS
20.0000 mg | ORAL_TABLET | Freq: Every day | ORAL | Status: DC
  Administered 2017-12-07: 20 mg via ORAL
  Filled 2017-12-07: qty 1

## 2017-12-07 MED ORDER — ORAL CARE MOUTH RINSE
15.0000 mL | Freq: Two times a day (BID) | OROMUCOSAL | Status: DC
  Administered 2017-12-08: 15 mL via OROMUCOSAL

## 2017-12-07 MED ORDER — CARBIDOPA-LEVODOPA 25-100 MG PO TABS
1.0000 | ORAL_TABLET | Freq: Three times a day (TID) | ORAL | Status: DC
Start: 2017-12-07 — End: 2017-12-07
  Administered 2017-12-07: 1 via ORAL
  Filled 2017-12-07 (×2): qty 1

## 2017-12-07 MED ORDER — CARBIDOPA-LEVODOPA ER 25-100 MG PO TBCR
1.0000 | EXTENDED_RELEASE_TABLET | ORAL | Status: DC
  Administered 2017-12-07 – 2017-12-08 (×2): 1 via ORAL
  Filled 2017-12-07 (×2): qty 1

## 2017-12-07 MED ORDER — CARBIDOPA-LEVODOPA 25-100 MG PO TABS
1.0000 | ORAL_TABLET | ORAL | Status: DC
  Administered 2017-12-07 – 2017-12-08 (×5): 1 via ORAL
  Filled 2017-12-07 (×8): qty 1

## 2017-12-07 MED ORDER — FUROSEMIDE 20 MG PO TABS
20.0000 mg | ORAL_TABLET | Freq: Once | ORAL | Status: AC
  Administered 2017-12-07: 20 mg via ORAL
  Filled 2017-12-07: qty 1

## 2017-12-07 MED FILL — Heparin Sodium (Porcine) Inj 1000 Unit/ML: INTRAMUSCULAR | Qty: 10 | Status: AC

## 2017-12-07 MED FILL — Heparin Sod (Porcine)-NaCl IV Soln 1000 Unit/500ML-0.9%: INTRAVENOUS | Qty: 500 | Status: AC

## 2017-12-07 NOTE — Progress Notes (Addendum)
CRITICAL VALUE ALERT  Critical Value:  Troponin 2.07  Date & Time Notied:  12/06/17 @2210   Provider Notified: Dr. Charlestine NightMeans  Orders Received/Actions taken: No new orders. Continue to monitor closely.

## 2017-12-07 NOTE — Progress Notes (Signed)
Progress Note  Patient Name: Sabrina Crawford Date of Encounter: 12/07/2017  Primary Cardiologist: Dr Jens Som  Subjective   Mild dyspnea; persistent CP  Inpatient Medications    Scheduled Meds: . aspirin EC  81 mg Oral Daily  . carbidopa-levodopa  1 tablet Oral TID  . heparin  5,000 Units Subcutaneous Q8H  . sodium chloride flush  3 mL Intravenous Q12H   Continuous Infusions: . sodium chloride     PRN Meds: sodium chloride, acetaminophen, ipratropium-albuterol, morphine injection, nitroGLYCERIN, ondansetron (ZOFRAN) IV, sodium chloride flush   Vital Signs    Vitals:   12/07/17 0407 12/07/17 0500 12/07/17 0600 12/07/17 0700  BP:  90/72 100/78 (!) 89/68  Pulse:  74 78 79  Resp:  11 16 14   Temp: 98.6 F (37 C)     TempSrc: Oral     SpO2:  95% 95% 91%  Weight:  185 lb 6.5 oz (84.1 kg)    Height:        Intake/Output Summary (Last 24 hours) at 12/07/2017 0740 Last data filed at 12/07/2017 0600 Gross per 24 hour  Intake 697 ml  Output 750 ml  Net -53 ml   Filed Weights   12/06/17 1049 12/07/17 0500  Weight: 185 lb (83.9 kg) 185 lb 6.5 oz (84.1 kg)    Telemetry    Sinus with brief PAT- Personally Reviewed   Physical Exam   GEN: No acute distress.   Neck: No JVD Cardiac: RRR, no murmurs, rubs, or gallops.  Respiratory: Clear to auscultation bilaterally. GI: Soft, nontender, non-distended  MS: No edema; radial cath site with no hematoma Neuro:  Nonfocal  Psych: Normal affect   Labs    Chemistry Recent Labs  Lab 12/06/17 1350 12/06/17 1834 12/07/17 0531  NA 141  --  138  K 4.1  --  4.0  CL 105  --  105  CO2 26  --  24  GLUCOSE 174*  --  116*  BUN 14  --  21  CREATININE 0.72 0.69 0.58  CALCIUM 9.4  --  9.1  GFRNONAA >60 >60 >60  GFRAA >60 >60 >60  ANIONGAP 10  --  9     Hematology Recent Labs  Lab 12/06/17 1350 12/06/17 1834 12/07/17 0531  WBC 11.3* 7.9 12.5*  RBC 4.51 4.51 4.22  HGB 13.1 13.1 12.2  HCT 41.5 41.0 38.6  MCV 92.0  90.9 91.5  MCH 29.0 29.0 28.9  MCHC 31.6 32.0 31.6  RDW 12.8 12.9 13.2  PLT 212 228 200    Cardiac Enzymes Recent Labs  Lab 12/06/17 1834 12/07/17 0531  TROPONINI 2.07* 3.07*    Recent Labs  Lab 12/06/17 1356  TROPIPOC 1.38*      Radiology    Mr Laqueta Jean ZO Contrast  Result Date: 12/06/2017 CLINICAL DATA:  Progressive supranuclear palsy. Frequent falls. Worsening vision. EXAM: MRI HEAD WITHOUT AND WITH CONTRAST TECHNIQUE: Multiplanar, multiecho pulse sequences of the brain and surrounding structures were obtained without and with intravenous contrast. CONTRAST:  17mL MULTIHANCE GADOBENATE DIMEGLUMINE 529 MG/ML IV SOLN Creatinine was obtained on site at [facility] at [address] Results: Creatinine 0.6 mg/dL. COMPARISON:  None. FINDINGS: The patient developed an allergic reaction immediately after injection of MultiHance, initially characterized by sneezing and lips swelling, subsequent difficulty breathing, redness, and mildly decreased level of consciousness. Benadryl and epinephrine were given. Patient was deemed unstable, and ambulance transfer from the outpatient setting to hospital emergency department was initiated. For this reason,  no post infusion images could be obtained. Brain: Diminished midbrain volume is more evident on the axial images (series 15, image 68, Mickey Mouse sign) than on the sagittal T1 weighted imaging (series 8, image 16, Hummingbird sign). No evidence for diffuse T2 hyperintensity in the pontine tegmentum, or the olivary nuclei, although mild midbrain tectal hyperintensity may be present. There is advanced cerebellar vermian and cerebellar hemispheric atrophy. Mild premature for age cerebral atrophy. Basal ganglia calcification is prominent. No cortical infarct, hemorrhage, mass lesion, hydrocephalus, or extra-axial fluid. 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 No aqueductal compression. Vascular: Flow voids are  maintained. Skull and upper cervical spine: Normal marrow signal. Sinuses/Orbits: Negative. Other: None. IMPRESSION: Constellation of findings with diminished midbrain volume relative to normal pons, consistent with the clinical diagnosis of progressive supranuclear palsy. No acute intracranial findings. 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 No aqueductal obstruction. Electronically Signed   By: Elsie StainJohn T Curnes M.D.   On: 12/06/2017 13:42    Patient Profile     62 y.o. female admitted with non-ST elevation myocardial infarction following allergic reaction to gadolinium during MRI.  Troponin was elevated.  Cardiac catheterization revealed 40-50 LAD and LV dysfunction consistent with takotsubo cardiomyopathy.  Assessment & Plan    1 non-ST elevation myocardial infarction-patient presented following an acute allergic reaction to gadolinium.  She was treated with Benadryl, Solu-Medrol and epinephrine.  She then developed chest pain and ruled in.  Cardiac catheterization revealed a 40 to 50% LAD and findings suggestive of stress cardiomyopathy.  LVEDP was elevated as well.  She continues to have mild dyspnea.  I will give Lasix 20 mg x 1.  We will treat with aspirin and statin.  Her blood pressure is borderline.  I would like to add low-dose carvedilol and ARB later as blood pressure allows.  Check echocardiogram this morning.  She will need follow-up echocardiogram in 8 to 12 weeks as her LV function will likely recover.    2 history of diabetes mellitus diet controlled-follow CBGs.  3 allergic reaction to gadolinium-treated with Benadryl, Solu-Medrol and epinephrine.  Will avoid in the future.  4 progressive supranuclear palsy    For questions or updates, please contact [facility] Please consult [email or web address] for contact info under Cardiology/STEMI.      Signed, Olga MillersBrian Crenshaw, MD  12/07/2017, 7:40 AM

## 2017-12-07 NOTE — Progress Notes (Signed)
  Echocardiogram 2D Echocardiogram has been performed.  [name], Sabrina Crawford 12/07/2017, 12:17 PM

## 2017-12-08 ENCOUNTER — Encounter (HOSPITAL_COMMUNITY): Payer: Self-pay

## 2017-12-08 LAB — BASIC METABOLIC PANEL
Anion gap: 7 (ref 5–15)
BUN: 24 mg/dL — ABNORMAL HIGH (ref 8–23)
CO2: 28 mmol/L (ref 22–32)
Calcium: 8.9 mg/dL (ref 8.9–10.3)
Chloride: 103 mmol/L (ref 98–111)
Creatinine, Ser: 0.58 mg/dL (ref 0.44–1.00)
GFR calc Af Amer: >60 mL/min (ref >60)
GLUCOSE: 129 mg/dL — AB (ref 70–99)
POTASSIUM: 3.8 mmol/L (ref 3.5–5.1)
Sodium: 138 mmol/L (ref 135–145)

## 2017-12-08 MED ORDER — CARVEDILOL 3.125 MG PO TABS: 3.1250 mg | ORAL_TABLET | Freq: Two times a day (BID) | ORAL | 5 refills | Status: AC

## 2017-12-08 MED ORDER — ATORVASTATIN CALCIUM 20 MG PO TABS: 20.0000 mg | ORAL_TABLET | Freq: Every day | ORAL | 5 refills | Status: AC

## 2017-12-08 MED ORDER — CARVEDILOL 3.125 MG PO TABS
3.1250 mg | ORAL_TABLET | Freq: Two times a day (BID) | ORAL | Status: DC
Start: 2017-12-08 — End: 2017-12-08
  Administered 2017-12-08: 3.125 mg via ORAL
  Filled 2017-12-08: qty 1

## 2017-12-08 MED ORDER — ASPIRIN 81 MG PO TBEC: 81.0000 mg | DELAYED_RELEASE_TABLET | Freq: Every day | ORAL | Status: AC

## 2017-12-08 MED ORDER — FUROSEMIDE 20 MG PO TABS: 20.0000 mg | ORAL_TABLET | Freq: Every day | ORAL | 5 refills | Status: AC | PRN

## 2017-12-08 NOTE — Progress Notes (Signed)
Progress Note  Patient Name: Sabrina Crawford Date of Encounter: 12/08/2017  Primary Cardiologist: Dr Jens Somrenshaw  Subjective   No dyspnea or CP; complains of shoulder pain   Inpatient Medications    Scheduled Meds: . aspirin EC  81 mg Oral Daily  . atorvastatin  20 mg Oral q1800  . carbidopa-levodopa  1 tablet Oral 5 times per day  . Carbidopa-Levodopa ER  1 tablet Oral 2 times per day  . heparin  5,000 Units Subcutaneous Q8H  . mouth rinse  15 mL Mouth Rinse BID  . pantoprazole  80 mg Oral Q1200  . sodium chloride flush  3 mL Intravenous Q12H  . venlafaxine XR  75 mg Oral Q breakfast   Continuous Infusions: . sodium chloride     PRN Meds: sodium chloride, acetaminophen, ipratropium-albuterol, morphine injection, nitroGLYCERIN, ondansetron (ZOFRAN) IV, sodium chloride flush   Vital Signs    Vitals:   12/08/17 0500 12/08/17 0530 12/08/17 0600 12/08/17 0719  BP: 114/72  119/74   Pulse: 87  82   Resp: 16  17   Temp:    97.7 F (36.5 C)  TempSrc:    Oral  SpO2: 97%  98%   Weight:  188 lb 11.4 oz (85.6 kg)    Height:        Intake/Output Summary (Last 24 hours) at 12/08/2017 0724 Last data filed at 12/08/2017 0600 Gross per 24 hour  Intake 693 ml  Output -  Net 693 ml   Filed Weights   12/06/17 1049 12/07/17 0500 12/08/17 0530  Weight: 185 lb (83.9 kg) 185 lb 6.5 oz (84.1 kg) 188 lb 11.4 oz (85.6 kg)    Telemetry    Sinus with pacs- Personally Reviewed   Physical Exam   GEN: WD WN No acute distress.   Neck: No JVD, supple Cardiac: RRR Respiratory: CTA GI: Soft, NT ND MS: No edema; radial cath site with no hematoma Neuro:  Grossly intact   Labs    Chemistry Recent Labs  Lab 12/06/17 1350 12/06/17 1834 12/07/17 0531 12/08/17 0317  NA 141  --  138 138  K 4.1  --  4.0 3.8  CL 105  --  105 103  CO2 26  --  24 28  GLUCOSE 174*  --  116* 129*  BUN 14  --  21 24*  CREATININE 0.72 0.69 0.58 0.58  CALCIUM 9.4  --  9.1 8.9  GFRNONAA >60 >60 >60  >60  GFRAA >60 >60 >60 >60  ANIONGAP 10  --  9 7     Hematology Recent Labs  Lab 12/06/17 1350 12/06/17 1834 12/07/17 0531  WBC 11.3* 7.9 12.5*  RBC 4.51 4.51 4.22  HGB 13.1 13.1 12.2  HCT 41.5 41.0 38.6  MCV 92.0 90.9 91.5  MCH 29.0 29.0 28.9  MCHC 31.6 32.0 31.6  RDW 12.8 12.9 13.2  PLT 212 228 200    Cardiac Enzymes Recent Labs  Lab 12/06/17 1834 12/07/17 0531  TROPONINI 2.07* 3.07*    Recent Labs  Lab 12/06/17 1356  TROPIPOC 1.38*      Radiology    Mr Laqueta JeanBrain W ONWo Contrast  Result Date: 12/06/2017 CLINICAL DATA:  Progressive supranuclear palsy. Frequent falls. Worsening vision. EXAM: MRI HEAD WITHOUT AND WITH CONTRAST TECHNIQUE: Multiplanar, multiecho pulse sequences of the brain and surrounding structures were obtained without and with intravenous contrast. CONTRAST:  17mL MULTIHANCE GADOBENATE DIMEGLUMINE 529 MG/ML IV SOLN Creatinine was obtained on site at [facility] at [address] Results: Creatinine 0.6 mg/dL. COMPARISON:  None. FINDINGS: The patient developed an allergic reaction immediately after injection of MultiHance, initially characterized by sneezing and lips swelling, subsequent difficulty breathing, redness, and mildly decreased level of consciousness. Benadryl and epinephrine were given. Patient was deemed unstable, and ambulance transfer from the outpatient setting to hospital emergency department was initiated. For this reason, no post infusion images could be obtained. Brain: Diminished midbrain volume is more evident on the axial images (series 15, image 68, Mickey Mouse sign) than on the sagittal T1 weighted imaging (series 8, image 16, Hummingbird sign). No evidence for diffuse T2 hyperintensity in the pontine tegmentum, or the olivary nuclei, although mild midbrain tectal hyperintensity may be present. There is advanced cerebellar vermian and cerebellar hemispheric atrophy. Mild premature for age cerebral atrophy. Basal ganglia  calcification is prominent. No cortical infarct, hemorrhage, mass lesion, hydrocephalus, or extra-axial fluid. 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 No aqueductal compression. Vascular: Flow voids are maintained. Skull and upper cervical spine: Normal marrow signal. Sinuses/Orbits: Negative. Other: None. IMPRESSION: Constellation of findings with diminished midbrain volume relative to normal pons, consistent with the clinical diagnosis of progressive supranuclear palsy. No acute intracranial findings. 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 No aqueductal obstruction. Electronically Signed   By: Elsie Stain M.D.   On: 12/06/2017 13:42    Patient Profile     62 y.o. female admitted with non-ST elevation myocardial infarction following allergic reaction to gadolinium during MRI.  Troponin was elevated.  Cardiac catheterization revealed 40-50 LAD and LV dysfunction consistent with takotsubo cardiomyopathy.  Assessment & Plan    1 non-ST elevation myocardial infarction-as outlined previously patient presented following an allergic reaction to gadolinium.  She was treated with epinephrine, Benadryl and Solu-Medrol.  She ruled in for a non-ST elevation myocardial infarction.  Cardiac catheterization revealed nonobstructive LAD disease.  Echocardiogram showed severely reduced LV function and pattern consistent with stress cardiomyopathy.  Continue aspirin and statin.  Blood pressure improved today.  I will add carvedilol 3.125 mg twice daily.  Her dyspnea has resolved.  We will provide a prescription for Lasix 20 mg daily as needed at discharge.  Ambulate this morning and discharge later this evening if stable.  She will need an echocardiogram in 6 to 8 weeks.  I would expect her LV function to improve over time.    2 history of diabetes mellitus diet controlled-FU primary care  3 allergic reaction to  gadolinium-documented as allergy  4 progressive supranuclear palsy  Plan discharge later today if stable as outlined.  Transition of care appointment with APP 1 week.  Follow-up with me 3 months. Greater than 30 minutes PA and physician time. D2  For questions or updates, please contact [facility] Please consult [email or web address] for contact info under Cardiology/STEMI.      Signed, Olga Millers, MD  12/08/2017, 7:24 AM

## 2017-12-08 NOTE — Discharge Summary (Signed)
Discharge Summary    Patient ID: Sabrina Crawford,  MRN: [id number], DOB/AGE: 63/11/1955 63 y.o.  Admit date: 12/06/2017 Discharge date: 12/08/2017  Primary Care Provider: Leilani Ableeese, Betti Primary Cardiologist: Olga MillersBrian Crenshaw, MD   Discharge Diagnoses    Principal Problem:   Takotsubo syndrome Active Problems:   Controlled type 2 diabetes mellitus without complication, without long-term current use of insulin (HCC)   Chest pain   Elevated troponin I level   History of Present Illness     Sabrina BienSusanne Rymer is a 62 y.o. female with past medical history of Type 2 DM, Parkinson's Disease, and Pineal Gland Cyst who presented to [facility] on 12/06/2017 after having an allergic reaction to gadolinium contrast dye while getting an outpatient MRI.  After receiving the contrast injection, she developed lip and facial swelling and feeling as if a "balloon" was in her throat. An Epi pen was not available and by review of notes she received 1 mg of Epinephrine and then began to experience chest pain which radiated to her shoulder. EMS was called and she was transported to [facility] for further evaluation.   While in the ED, her initial troponin was found to be elevated to 1.38 and bedside echocardiogram showed a large anterior, septal and apical wall motion abnormality possibly consistent with Takotsubo cardiomyopathy, therefore she was admitted for further management with plans for a cardiac catheterization.    Hospital Course     Consultants: None  Her cardiac catheterization was performed later that afternoon and showed normal LM, RCA, and LCx with 40-50% eccentric stenosis in the LAD  beyond the origin of a large first diagonal. The left ventricular contractile pattern with "apical ballooning" pattern of anterior anteroapical and inferoapical akinesis was compatible with stress cardiomyopathy/Takotsubo syndrome. Troponin values peaked at 3.07. A formal echocardiogram the following day showed  her EF was 25% to 30% with severe hypokinesis of the apical 2/3 of the left ventricle with normal basal segment contractility and no significant valve abnormalities.   She did have mild dyspnea during admission and received intermittent doses of IV Lasix. This was changed to Lasix 20mg  daily PRN at the time of discharge. Was also started on Coreg 3.125mg  BID along with ASA and statin therapy. Was not started on ACE-I/ARB/ARNI given her soft BP. She will undergo a repeat outpatient echocardiogram in 6-8 weeks to reassess LV function.   She was last examined by Dr. Jens Somrenshaw on 12/08/2017 and deemed stable for discharge. A staff message has been sent to the office to arrange for a 7-day TOC appointment with an APP then follow-up with Dr. Marinus Mawresnshaw in 3 months. She was discharged home in stable condition.   _____________  Discharge Vitals Blood pressure 100/65, pulse 73, temperature 97.7 F (36.5 C), temperature source Oral, resp. rate 16, height 5\' 8"  (1.727 m), weight 188 lb 11.4 oz (85.6 kg), SpO2 98 %.  Filed Weights   12/06/17 1049 12/07/17 0500 12/08/17 0530  Weight: 185 lb (83.9 kg) 185 lb 6.5 oz (84.1 kg) 188 lb 11.4 oz (85.6 kg)    Labs & Radiologic Studies     CBC Recent Labs    12/06/17 1350 12/06/17 1834 12/07/17 0531  WBC 11.3* 7.9 12.5*  NEUTROABS 10.2* 7.0  --   HGB 13.1 13.1 12.2  HCT 41.5 41.0 38.6  MCV 92.0 90.9 91.5  PLT 212 228 200   Basic Metabolic Panel Recent Labs    40/98/1106/28/19 0531 12/08/17 0317  NA 138 138  K 4.0 3.8  CL 105 103  CO2 24 28  GLUCOSE 116* 129*  BUN 21 24*  CREATININE 0.58 0.58  CALCIUM 9.1 8.9   Liver Function Tests No results for input(s): AST, ALT, ALKPHOS, BILITOT, PROT, ALBUMIN in the last 72 hours. No results for input(s): LIPASE, AMYLASE in the last 72 hours. Cardiac Enzymes Recent Labs    12/06/17 1834 12/07/17 0531  TROPONINI 2.07* 3.07*   BNP Invalid input(s): POCBNP D-Dimer No results for input(s): DDIMER in the  last 72 hours. Hemoglobin A1C Recent Labs    12/06/17 1834  HGBA1C 6.6*   Fasting Lipid Panel No results for input(s): CHOL, HDL, LDLCALC, TRIG, CHOLHDL, LDLDIRECT in the last 72 hours. Thyroid Function Tests Recent Labs    12/06/17 1834  TSH 0.589    Mr Laqueta Jean ZO Contrast  Result Date: 12/06/2017 CLINICAL DATA:  Progressive supranuclear palsy. Frequent falls. Worsening vision. EXAM: MRI HEAD WITHOUT AND WITH CONTRAST TECHNIQUE: Multiplanar, multiecho pulse sequences of the brain and surrounding structures were obtained without and with intravenous contrast. CONTRAST:  17mL MULTIHANCE GADOBENATE DIMEGLUMINE 529 MG/ML IV SOLN Creatinine was obtained on site at [facility] at [address] Results: Creatinine 0.6 mg/dL. COMPARISON:  None. FINDINGS: The patient developed an allergic reaction immediately after injection of MultiHance, initially characterized by sneezing and lips swelling, subsequent difficulty breathing, redness, and mildly decreased level of consciousness. Benadryl and epinephrine were given. Patient was deemed unstable, and ambulance transfer from the outpatient setting to hospital emergency department was initiated. For this reason, no post infusion images could be obtained. Brain: Diminished midbrain volume is more evident on the axial images (series 15, image 68, Mickey Mouse sign) than on the sagittal T1 weighted imaging (series 8, image 16, Hummingbird sign). No evidence for diffuse T2 hyperintensity in the pontine tegmentum, or the olivary nuclei, although mild midbrain tectal hyperintensity may be present. There is advanced cerebellar vermian and cerebellar hemispheric atrophy. Mild premature for age cerebral atrophy. Basal ganglia calcification is prominent. No cortical infarct, hemorrhage, mass lesion, hydrocephalus, or extra-axial fluid. 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 No aqueductal compression. Vascular: Flow voids are  maintained. Skull and upper cervical spine: Normal marrow signal. Sinuses/Orbits: Negative. Other: None. IMPRESSION: Constellation of findings with diminished midbrain volume relative to normal pons, consistent with the clinical diagnosis of progressive supranuclear palsy. No acute intracranial findings. 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 No aqueductal obstruction. Electronically Signed   By: Elsie Stain M.D.   On: 12/06/2017 13:42   Dg Knee Complete 4 Views Left  Result Date: 11/13/2017 CLINICAL DATA:  Left knee pain EXAM: LEFT KNEE - COMPLETE 4+ VIEW COMPARISON:  None. FINDINGS: No evidence of fracture, dislocation, or joint effusion. No evidence of arthropathy or other focal bone abnormality. Soft tissues are unremarkable. IMPRESSION: Negative. Electronically Signed   By: Charlett Nose M.D.   On: 11/13/2017 09:23     Diagnostic Studies/Procedures     Cardiac Catheterization: 12/06/2017  Right dominant coronary anatomy.  The right coronary is widely patent.  Normal left main  Widely patent LAD with 40 to 50% eccentric stenosis in the LAD beyond the origin of a large first diagonal.  The first diagonal appears to supply the left ventricular apex.  The LAD beyond the bifurcation with the diagonal may have an intramyocardial course.  Widely patent circumflex coronary artery.  Abnormal left ventricular contractile pattern with "apical ballooning" pattern of anterior anteroapical and inferoapical akinesis.  Elevated EDP rater than 30 mmHg.  Overall findings are compatible with stress cardiomyopathy/Takotsubo syndrome.  RECOMMENDATIONS:   Supportive.  The Lasix 20 mg given because of severe systolic dysfunction, elevated EDP, and dyspnea.  May need pressor support if develops hypotension.  Echocardiogram: 12/07/2017 Study Conclusions  - Left ventricle: The cavity size was normal. Systolic function was    severely reduced. The estimated ejection fraction was in the   range of 25% to 30%. Severe hypokinesis of the apical 2/3 of the   left ventricle with normal basal segment contractility. Doppler   parameters are consistent with abnormal left ventricular   relaxation (grade 1 diastolic dysfunction). No evidence of   thrombus. - Pulmonary arteries: Systolic pressure was mildly increased. PA   peak pressure: 44 mm Hg (S).  Impressions:  - Echo abnormalities are highly compatible with stress   cardiomyopathy (takotsubo syndrome), although multivessel CAD   cannot be excluded solely on the basis of the echocardiographic   data.  Disposition   Pt is being discharged home today in good condition.  Follow-up Plans & Appointments    Follow-up Information    Lewayne Bunting, MD Follow up.   Specialty:  Cardiology Why:  The office will contact you within 2-3 business days to arrange follow-up. If you do not heart from them in this time frame, please call the number provided.  Contact information: [address] [place] [address] [phone]          Discharge Instructions    Diet - low sodium heart healthy   Complete by:  As directed    Discharge instructions   Complete by:  As directed    PLEASE REMEMBER TO BRING ALL OF YOUR MEDICATIONS TO EACH OF YOUR FOLLOW-UP OFFICE VISITS.  PLEASE ATTEND ALL SCHEDULED FOLLOW-UP APPOINTMENTS.   Activity: Increase activity slowly as tolerated. You may shower, but no soaking baths (or swimming) for 1 week. No driving for 24 hours. No lifting over 10 lbs for 2 weeks. No sexual activity for 2 weeks.   You May Return to Work: in 1 week (if applicable)  Wound Care: You may wash cath site gently with soap and water. Keep cath site clean and dry. If you notice pain, swelling, bleeding or pus at your cath site, please call [phone].   Increase activity slowly   Complete by:  As directed       Discharge Medications       Medication List    STOP taking these medications   GINKGO BILOBA PO   ibuprofen 800 MG tablet Commonly known as:  ADVIL,MOTRIN     TAKE these medications   aspirin 81 MG EC tablet Take 1 tablet (81 mg total) by mouth daily. Start taking on:  12/09/2017   atorvastatin 20 MG tablet Commonly known as:  LIPITOR Take 1 tablet (20 mg total) by mouth daily at 6 PM.   carbidopa-levodopa 25-100 MG tablet Commonly known as:  SINEMET IR Take 1 tablet by mouth 5 (five) times daily.   Carbidopa-Levodopa ER 25-100 MG tablet controlled release Commonly known as:  SINEMET CR Take 1 tablet by mouth 2 (two) times daily.   carvedilol 3.125 MG tablet Commonly known as:  COREG Take 1 tablet (3.125 mg total) by mouth 2 (two) times daily with a meal.   esomeprazole 40 MG capsule Commonly known as:  NEXIUM Take 40  mg by mouth every morning.   furosemide 20 MG tablet Commonly known as:  LASIX Take 1 tablet (20 mg total) by mouth daily as needed. For fluid or weight gain greater than 2 pounds overnight.   HM MULTIVITAMIN ADULT GUMMY PO Take 2 each by mouth daily at 6 (six) AM.   Melatonin 5 MG Tabs Take 5 mg by mouth at bedtime.   omega-3 acid ethyl esters 1 g capsule Commonly known as:  LOVAZA Take 1 g by mouth daily at 2 PM.   PROAIR HFA 108 (90 Base) MCG/ACT inhaler Generic drug:  albuterol Inhale 1 puff into the lungs every 4 (four) hours as needed for shortness of breath or wheezing.   TURMERIC PO Take 1 capsule by mouth daily at 6 PM.   venlafaxine XR 75 MG 24 hr capsule Commonly known as:  EFFEXOR-XR Take 75 mg by mouth daily at 2 PM.   vitamin E 1000 UNIT capsule Generic drug:  vitamin E Take 1,000 Units by mouth at bedtime.         Allergies Allergies  Allergen Reactions  . Chamomile Shortness Of Breath  . Gadolinium Derivatives Anaphylaxis    12/06/17 After receiving Multihance: severe lip swelling, redness, laryngeal edema; to ED via EMS  . Iodinated  Diagnostic Agents Anaphylaxis    IV contrast  . Latex Anaphylaxis  . Methocarbamol Shortness Of Breath     Acute coronary syndrome (MI, NSTEMI, STEMI, etc) this admission?: Yes.     AHA/ACC Clinical Performance & Quality Measures: 1. Aspirin prescribed? - Yes 2. ADP Receptor Inhibitor (Plavix/Clopidogrel, Brilinta/Ticagrelor or Effient/Prasugrel) prescribed (includes medically managed patients)? - No - No PCI - Takotsubo Cardiomyopathy 3. Beta Blocker prescribed? - Yes 4. High Intensity Statin (Lipitor 40-80mg  or Crestor 20-40mg ) prescribed? - Yes 5. EF assessed during THIS hospitalization? - Yes 6. For EF <40%, was ACEI/ARB prescribed? - No - Reason:  Soft BP 7. For EF <40%, Aldosterone Antagonist (Spironolactone or Eplerenone) prescribed? - No - Reason:  Soft BP 8. Cardiac Rehab Phase II ordered (Included Medically managed Patients)? - No - Takotsubo Cardiomyopathy    Outstanding Labs/Studies   Echo in 6-8 weeks.   Duration of Discharge Encounter   Greater than 30 minutes including physician time.  Signed, Ellsworth Lennox, PA-C 12/08/2017, 10:34 AM

## 2017-12-08 NOTE — Plan of Care (Signed)
  Problem: Education: Goal: Knowledge of General Education information will improve Outcome: Progressing   Problem: Health Behavior/Discharge Planning: Goal: Ability to manage health-related needs will improve Outcome: Progressing   Problem: Clinical Measurements: Goal: Ability to maintain clinical measurements within normal limits will improve Outcome: Progressing Goal: Will remain free from infection Outcome: Progressing Goal: Diagnostic test results will improve Outcome: Progressing Goal: Respiratory complications will improve Outcome: Progressing Goal: Cardiovascular complication will be avoided Outcome: Progressing   Problem: Activity: Goal: Risk for activity intolerance will decrease Outcome: Progressing   Problem: Nutrition: Goal: Adequate nutrition will be maintained Outcome: Progressing   Problem: Coping: Goal: Level of anxiety will decrease Outcome: Progressing   Problem: Elimination: Goal: Will not experience complications related to bowel motility Outcome: Progressing Goal: Will not experience complications related to urinary retention Outcome: Progressing   Problem: Pain Managment: Goal: General experience of comfort will improve Outcome: Progressing   Problem: Safety: Goal: Ability to remain free from injury will improve Outcome: Progressing   Problem: Skin Integrity: Goal: Risk for impaired skin integrity will decrease Outcome: Progressing   Problem: Education: Goal: Understanding of cardiac disease, CV risk reduction, and recovery process will improve Outcome: Progressing Goal: Understanding of medication regimen will improve Outcome: Progressing   Problem: Activity: Goal: Ability to tolerate increased activity will improve Outcome: Progressing   Problem: Cardiac: Goal: Ability to achieve and maintain adequate cardiopulmonary perfusion will improve Outcome: Progressing Goal: Vascular access site(s) Level 0-1 will be maintained Outcome:  Progressing   Problem: Health Behavior/Discharge Planning: Goal: Ability to safely manage health-related needs after discharge will improve Outcome: Progressing   

## 2017-12-12 ENCOUNTER — Encounter: Payer: Self-pay | Admitting: Cardiology

## 2017-12-17 ENCOUNTER — Ambulatory Visit: Payer: Medicaid Other | Admitting: Cardiology

## 2017-12-17 ENCOUNTER — Emergency Department (HOSPITAL_COMMUNITY): Payer: Medicaid Other

## 2017-12-17 ENCOUNTER — Other Ambulatory Visit: Payer: Self-pay

## 2017-12-17 ENCOUNTER — Encounter: Payer: Self-pay | Admitting: Cardiology

## 2017-12-17 ENCOUNTER — Emergency Department (HOSPITAL_COMMUNITY)
Admission: EM | Admit: 2017-12-17 | Discharge: 2017-12-17 | Disposition: A | Discharge status: Home / Self Care | Payer: Medicaid Other | Attending: Emergency Medicine | Admitting: Emergency Medicine

## 2017-12-17 ENCOUNTER — Encounter (HOSPITAL_COMMUNITY): Payer: Self-pay | Admitting: *Deleted

## 2017-12-17 VITALS — BP 124/64 | HR 61 | Ht 63.0 in | Wt 188.0 lb

## 2017-12-17 DIAGNOSIS — E348 Other specified endocrine disorders: Secondary | ICD-10-CM | POA: Diagnosis not present

## 2017-12-17 DIAGNOSIS — Z7982 Long term (current) use of aspirin: Secondary | ICD-10-CM | POA: Insufficient documentation

## 2017-12-17 DIAGNOSIS — Z91040 Latex allergy status: Secondary | ICD-10-CM | POA: Diagnosis not present

## 2017-12-17 DIAGNOSIS — Z79899 Other long term (current) drug therapy: Secondary | ICD-10-CM | POA: Insufficient documentation

## 2017-12-17 DIAGNOSIS — Z87891 Personal history of nicotine dependence: Secondary | ICD-10-CM | POA: Diagnosis not present

## 2017-12-17 DIAGNOSIS — Z91041 Radiographic dye allergy status: Secondary | ICD-10-CM | POA: Insufficient documentation

## 2017-12-17 DIAGNOSIS — I5181 Takotsubo syndrome: Secondary | ICD-10-CM | POA: Diagnosis not present

## 2017-12-17 DIAGNOSIS — G20 Parkinson's disease: Secondary | ICD-10-CM | POA: Diagnosis not present

## 2017-12-17 DIAGNOSIS — R296 Repeated falls: Secondary | ICD-10-CM

## 2017-12-17 DIAGNOSIS — E119 Type 2 diabetes mellitus without complications: Secondary | ICD-10-CM

## 2017-12-17 DIAGNOSIS — R1012 Left upper quadrant pain: Secondary | ICD-10-CM

## 2017-12-17 DIAGNOSIS — R109 Unspecified abdominal pain: Secondary | ICD-10-CM | POA: Insufficient documentation

## 2017-12-17 LAB — CBC
HEMATOCRIT: 36.2 % (ref 36.0–46.0)
Hemoglobin: 11.6 g/dL — ABNORMAL LOW (ref 12.0–15.0)
MCH: 29.7 pg (ref 26.0–34.0)
MCHC: 32 g/dL (ref 30.0–36.0)
MCV: 92.6 fL (ref 78.0–100.0)
Platelets: 252 10*3/uL (ref 150–400)
RBC: 3.91 MIL/uL (ref 3.87–5.11)
RDW: 13.5 % (ref 11.5–15.5)
WBC: 5.8 10*3/uL (ref 4.0–10.5)

## 2017-12-17 LAB — COMPREHENSIVE METABOLIC PANEL
ALT: 11 U/L (ref 0–44)
AST: 23 U/L (ref 15–41)
Albumin: 3.8 g/dL (ref 3.5–5.0)
Alkaline Phosphatase: 92 U/L (ref 38–126)
Anion gap: 9 (ref 5–15)
BUN: 18 mg/dL (ref 8–23)
CO2: 30 mmol/L (ref 22–32)
Calcium: 9.1 mg/dL (ref 8.9–10.3)
Chloride: 103 mmol/L (ref 98–111)
Creatinine, Ser: 0.64 mg/dL (ref 0.44–1.00)
GFR calc Af Amer: >60 mL/min (ref >60)
GFR calc non Af Amer: >60 mL/min (ref >60)
GLUCOSE: 119 mg/dL — AB (ref 70–99)
POTASSIUM: 4.2 mmol/L (ref 3.5–5.1)
SODIUM: 142 mmol/L (ref 135–145)
TOTAL PROTEIN: 7 g/dL (ref 6.5–8.1)
Total Bilirubin: 0.8 mg/dL (ref 0.3–1.2)

## 2017-12-17 LAB — URINALYSIS, ROUTINE W REFLEX MICROSCOPIC
BILIRUBIN URINE: NEGATIVE
GLUCOSE, UA: NEGATIVE mg/dL
Hgb urine dipstick: NEGATIVE
KETONES UR: NEGATIVE mg/dL
Leukocytes, UA: NEGATIVE
NITRITE: NEGATIVE
PH: 5 (ref 5.0–8.0)
Protein, ur: NEGATIVE mg/dL
Specific Gravity, Urine: 1.013 (ref 1.005–1.030)

## 2017-12-17 LAB — LIPASE, BLOOD: LIPASE: 44 U/L (ref 11–51)

## 2017-12-17 LAB — D-DIMER, QUANTITATIVE: D-Dimer, Quant: 1.23 ug/mL-FEU — ABNORMAL HIGH (ref 0.00–0.50)

## 2017-12-17 MED ORDER — TECHNETIUM TC 99M DIETHYLENETRIAME-PENTAACETIC ACID: 30.0000 | Freq: Once | INTRAVENOUS | Status: DC | PRN

## 2017-12-17 MED ORDER — VALSARTAN 40 MG PO TABS: 20.0000 mg | ORAL_TABLET | Freq: Every day | ORAL | 3 refills | Status: AC

## 2017-12-17 MED ORDER — ONDANSETRON HCL 4 MG/2ML IJ SOLN
4.0000 mg | Freq: Once | INTRAMUSCULAR | Status: AC
  Administered 2017-12-17: 4 mg via INTRAVENOUS
  Filled 2017-12-17: qty 2

## 2017-12-17 MED ORDER — TECHNETIUM TC 99M DIETHYLENETRIAME-PENTAACETIC ACID
30.0000 | Freq: Once | INTRAVENOUS | Status: AC | PRN
  Administered 2017-12-17: 30 via RESPIRATORY_TRACT

## 2017-12-17 MED ORDER — HYDROMORPHONE HCL 1 MG/ML IJ SOLN
1.0000 mg | Freq: Once | INTRAMUSCULAR | Status: AC
  Administered 2017-12-17: 1 mg via INTRAVENOUS
  Filled 2017-12-17: qty 1

## 2017-12-17 NOTE — Assessment & Plan Note (Signed)
Severe reaction to MRI contrast 12/06/17-(pineal cyst)

## 2017-12-17 NOTE — ED Provider Notes (Signed)
Medical screening examination/treatment/procedure(s) were conducted as a shared visit with non-physician practitioner(s) and myself.  I personally evaluated the patient during the encounter.  None   Patient sent in from her MDs office.  Patient with complaint of left upper quadrant abdominal pain here.  Patient recently hospitalized following an allergic reaction to dye contrast that occurred with an outpatient CT scan.  And patient inadvertently had gotten too much epinephrine's concerns about an acute cardiac event went to Cath Lab with cardiology and there was no significant injury.  Work-up here today for the abdominal pain CT of abdomen without any acute abnormalities.  Patient had an elevated d-dimer patient due to her dye allergy not a candidate for CT angios so she got VQ scan.  VQ scan had low probability for pulmonary embolus.  Patient had been seen by me when she was admitted for the acute allergic reaction.  Patient's face is not swollen today lips are back to normal.  Lungs are clear abdomen without any significant abnormalities on exam.  Patient stable for discharge home and  close follow-up with her primary care doctor.  If symptoms persist may need to colonoscopy.  Or upper endoscopy.   Vanetta MuldersZackowski, Royetta Probus, MD 12/17/17 [phone]

## 2017-12-17 NOTE — ED Notes (Signed)
Patient transported to X-ray 

## 2017-12-17 NOTE — ED Triage Notes (Signed)
Pt in from her MD office c/o LUQ abdominal pain, pt sent here for evaluation on her spleen, pt recently in the hospital, no distress noted

## 2017-12-17 NOTE — Assessment & Plan Note (Signed)
NSTEMI 12/06/17 after being treated for an allergic reaction 12/06/17 EF 25-30% with apical AK

## 2017-12-17 NOTE — ED Notes (Addendum)
Pt transported to CT ?

## 2017-12-17 NOTE — Assessment & Plan Note (Signed)
Hgb A1c-6.6. During her admission 12/06/17- on no medications

## 2017-12-17 NOTE — Discharge Instructions (Addendum)
The CT scan of your abdomen was reassuring, normal spleen size. You do not have a blood clot in your lungs. Blood work was reassuring.   Please take Tylenol at home for pain.  You can also apply heat to help with your symptoms.   Return to the ER if you have any new or concerning symptoms like fever, vomiting, worsening pain or trouble breathing.

## 2017-12-17 NOTE — Assessment & Plan Note (Signed)
History of falls, uses a cane

## 2017-12-17 NOTE — Patient Instructions (Addendum)
Medication Instructions:  START Valsartan 20mg  Take 1 tablet daily at bedtime (medication comes in a 40mg  tab break tablet in half)  Labwork: None   Testing/Procedures: None   Follow-Up: Your physician recommends that you schedule a follow-up appointment in: 6 weeks with Corine ShelterLuke Kilroy, PA-C Your physician recommends that you schedule a follow-up appointment in: 3 months with Dr Jens Somrenshaw  Any Other Special Instructions Will Be Listed Below (If Applicable). If you need a refill on your cardiac medications before your next appointment, please call your pharmacy.

## 2017-12-17 NOTE — Assessment & Plan Note (Signed)
Being followed at [facility] 

## 2017-12-17 NOTE — ED Provider Notes (Signed)
[facility] EMERGENCY DEPARTMENT Provider Note   CSN: [id number] Arrival date & time: 12/17/17  1028     History   Chief Complaint Chief Complaint  Patient presents with  . Abdominal Pain    HPI Sabrina Crawford is a 62 y.o. female.  HPI  Sabrina Crawford is a 62yo female with a history of takotsubo cardiomyopathy, Parkinson's disease, pineal gland cyst, anaphylaxis due to contrast dye who presents to the emergency department for evaluation of left upper quadrant abdominal pain.  Patient reports that pain has gradually worsened for the last 2 days.  She reports pain is constant, sharp and 8/10 in severity.  It is acutely worsened with palpation, with movement or with deep breathing.  She is concerned about her spleen.  Has not taken any over-the-counter medications for pain.  No alleviating factors.  She also reports that she has had some worsening shortness of breath for the past 2 days.  She states that she has a history of asthma, and used her albuterol inhaler yesterday evening.  She denies any wheezing.  Denies fevers, chills, cough, chest pain, lightheadedness, diaphoresis, nausea/vomiting, diarrhea, hematochezia, melena, hematuria, urinary frequency. Denies history of DVT/PE, unilateral leg swelling or calf tenderness, active cancer, exogenous estrogen.  She was recently discharged 6/29 for an NSTEMI following an allergic reaction to gadolinium during an MRI to evaluate a pineal gland cyst in which she had a cardiac catheterization which revealed a left ventricular dysfunction and a 40-50 LAD stenosis which was consistent with Takotsubo cardiomyopathy.   Past Medical History:  Diagnosis Date  . Acute anaphylaxis    To contrast dye  . Blood transfusion without reported diagnosis   . Parkinson's disease (HCC)   . Pineal gland cyst   . Takotsubo cardiomyopathy    a. 11/2017: NSTEMI in the setting of allergic reaction with cath showing 40-50% LAD stenosis and EF reduced  to 25-30%     Patient Active Problem List   Diagnosis Date Noted  . Allergy to imaging contrast media 12/17/2017  . Abdominal pain 12/17/2017  . Chest pain 12/06/2017  . Elevated troponin I level 12/06/2017  . Takotsubo syndrome 12/06/2017  . Non-insulin dependent type 2 diabetes mellitus (HCC) 11/23/2017  . Falls frequently 11/23/2017  . 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 Parkinsons (HCC) 11/23/2017    Past Surgical History:  Procedure Laterality Date  . ABDOMINAL HYSTERECTOMY    . APPENDECTOMY    . BREAST SURGERY    . CHOLECYSTECTOMY    . LEFT HEART CATH AND CORONARY ANGIOGRAPHY N/A 12/06/2017   Procedure: LEFT HEART CATH AND CORONARY ANGIOGRAPHY;  Surgeon: Lyn Records, MD;  Location: [facility] INVASIVE CV LAB;  Service: Cardiovascular;  Laterality: N/A;     OB History   None      Home Medications    Prior to Admission medications   Medication Sig Start Date End Date Taking? Authorizing Provider  aspirin EC 81 MG EC tablet Take 1 tablet (81 mg total) by mouth daily. 12/09/17   Strader, Lennart Pall, PA-C  atorvastatin (LIPITOR) 20 MG tablet Take 1 tablet (20 mg total) by mouth daily at 6 PM. 12/08/17   Strader, [place] M, PA-C  carbidopa-levodopa (SINEMET IR) 25-100 MG tablet Take 1 tablet by mouth 5 (five) times daily.  11/12/17   [provider]  Carbidopa-Levodopa ER (SINEMET CR) 25-100 MG tablet controlled release Take 1 tablet by mouth 2 (two) times daily. 11/23/17   [provider]  carvedilol (COREG)  3.125 MG tablet Take 1 tablet (3.125 mg total) by mouth 2 (two) times daily with a meal. 12/08/17   Strader, [place] M, PA-C  esomeprazole (NEXIUM) 40 MG capsule Take 40 mg by mouth every morning. 11/17/17   [provider]  furosemide (LASIX) 20 MG tablet Take 1 tablet (20 mg total) by mouth daily as needed. For fluid or weight gain greater than 2 pounds overnight. 12/08/17 12/08/18  Strader, Lennart Pall, PA-C  Melatonin 5 MG TABS Take 5 mg by mouth at  bedtime.    [provider]  Multiple Vitamins-Minerals (HM MULTIVITAMIN ADULT GUMMY PO) Take 2 each by mouth daily at 6 (six) AM.    [provider]  omega-3 acid ethyl esters (LOVAZA) 1 g capsule Take 1 g by mouth daily at 2 PM.    [provider]  PROAIR HFA 108 (90 Base) MCG/ACT inhaler Inhale 1 puff into the lungs every 4 (four) hours as needed for shortness of breath or wheezing. 11/12/17   [provider]  TURMERIC PO Take 1 capsule by mouth daily at 6 PM.    [provider]  valsartan (DIOVAN) 40 MG tablet Take 0.5 tablets (20 mg total) by mouth at bedtime. 12/17/17   Abelino Derrick, PA-C  venlafaxine XR (EFFEXOR-XR) 75 MG 24 hr capsule Take 75 mg by mouth daily at 2 PM. 11/23/17   [provider]  vitamin E (VITAMIN E) 1000 UNIT capsule Take 1,000 Units by mouth at bedtime.    [provider]    Family History Family History  Problem Relation Age of Onset  . Heart failure Mother   . Heart failure Father     Social History Social History   Tobacco Use  . Smoking status: Former Smoker    Last attempt to quit: 2000    Years since quitting: 19.5  . Smokeless tobacco: Never Used  . Tobacco comment: 1pk/week.   Substance Use Topics  . Alcohol use: Yes    Alcohol/week: 1.2 oz    Types: 2 Glasses of wine per week    Comment: occasionally   . Drug use: Never     Allergies   Chamomile; Gadolinium derivatives; Iodinated diagnostic agents; Latex; and Methocarbamol   Review of Systems Review of Systems  Constitutional: Negative for chills, diaphoresis and fever.  HENT: Negative for congestion and sore throat.   Respiratory: Positive for shortness of breath and wheezing. Negative for cough.   Cardiovascular: Negative for chest pain and leg swelling.  Gastrointestinal: Positive for abdominal pain (LUQ). Negative for diarrhea, nausea and vomiting.  Genitourinary: Negative for difficulty urinating, dysuria, flank pain,  frequency and vaginal discharge.  Musculoskeletal: Negative for back pain.  Skin: Negative for rash.  Neurological: Negative for weakness, light-headedness, numbness and headaches.  Psychiatric/Behavioral: Negative for agitation.     Physical Exam Updated Vital Signs BP 109/71 (BP Location: Left Arm)   Pulse 60   Temp 97.7 F (36.5 C) (Oral)   Resp 16   Ht 5\' 2"  (1.575 m)   Wt 77.1 kg (170 lb)   SpO2 95%   BMI 31.09 kg/m   Physical Exam  Constitutional: She is oriented to person, place, and time. She appears well-developed and well-nourished. No distress.  Flat affect, no acute distress.  HENT:  Head: Normocephalic and atraumatic.  Mouth/Throat: Oropharynx is clear and moist. No oropharyngeal exudate.  Eyes: Pupils are equal, round, and reactive to light. Conjunctivae are normal. Right eye exhibits no discharge. Left  eye exhibits no discharge.  Neck: Normal range of motion. Neck supple.  Cardiovascular: Normal rate, regular rhythm and intact distal pulses.  Pulmonary/Chest: Effort normal and breath sounds normal. No stridor. No respiratory distress. She has no wheezes. She has no rales.  Abdominal: Soft.  Abdomen soft nondistended.  Acutely tender to palpation in the left upper quadrant.  No guarding or rigidity.  No CVA tenderness.  Musculoskeletal:  No midline T-spine or L-spine tenderness.  Neurological: She is alert and oriented to person, place, and time. Coordination normal.  Skin: Skin is warm and dry. Capillary refill takes less than 2 seconds. She is not diaphoretic.  Psychiatric: She has a normal mood and affect. Her behavior is normal.  Nursing note and vitals reviewed.    ED Treatments / Results  Labs (all labs ordered are listed, but only abnormal results are displayed) Labs Reviewed  COMPREHENSIVE METABOLIC PANEL - Abnormal; Notable for the following components:      Result Value   Glucose, Bld 119 (*)    All other components within normal limits  CBC -  Abnormal; Notable for the following components:   Hemoglobin 11.6 (*)    All other components within normal limits  D-DIMER, QUANTITATIVE (NOT AT [facility]) - Abnormal; Notable for the following components:   D-Dimer, Quant 1.23 (*)    All other components within normal limits  LIPASE, BLOOD  URINALYSIS, ROUTINE W REFLEX MICROSCOPIC    EKG None  Radiology Ct Abdomen Pelvis Wo Contrast  Result Date: 12/17/2017 CLINICAL DATA:  Left upper quadrant abdominal pain. EXAM: CT ABDOMEN AND PELVIS WITHOUT CONTRAST TECHNIQUE: Multidetector CT imaging of the abdomen and pelvis was performed following the standard protocol without IV contrast. COMPARISON:  None. FINDINGS: Lower chest: Subsegmental scarring versus atelectasis in the dependent lung bases. Hepatobiliary: Normal liver size. No liver mass. Cholecystectomy. No biliary ductal dilatation. Pancreas: Normal, with no mass or duct dilation. Spleen: Normal size. No mass. Adrenals/Urinary Tract: Normal adrenals. No renal stones. No hydronephrosis. Slightly hyperdense 0.5 cm renal cortical lesion in the posterior upper left kidney with density 88 HU. No contour deforming renal lesions. Normal bladder. Stomach/Bowel: Normal non-distended stomach. Normal caliber small bowel with no small bowel wall thickening. Appendectomy. Moderate stool throughout the large bowel. No large bowel wall thickening, significant diverticulosis or pericolonic fat stranding. Vascular/Lymphatic: Atherosclerotic nonaneurysmal abdominal aorta. No pathologically enlarged lymph nodes in the abdomen or pelvis. Reproductive: Status post hysterectomy, with no abnormal findings at the vaginal cuff. No adnexal mass. Other: No pneumoperitoneum, ascites or focal fluid collection. Small supraumbilical midline fat containing abdominal hernia. Musculoskeletal: No aggressive appearing focal osseous lesions. Partially visualized intact appearing left breast prosthesis. Bilateral L5 pars defects with severe  degenerative disc disease and 5 mm anterolisthesis at L5-S1. IMPRESSION: 1. No acute abnormality. No evidence of bowel obstruction or acute bowel inflammation. Normal size spleen. 2. Moderate colonic stool, which may indicate constipation. 3. Tiny hyperdense 0.5 cm renal cortical lesion in the posterior upper left kidney, presumably a tiny hemorrhagic/proteinaceous renal cyst. No follow-up is required. This recommendation follows ACR consensus guidelines: Management of the Incidental Renal Mass on CT: A White Paper of the ACR Incidental Findings Committee. J Am Coll Radiol 2018;15:264-273. 4.  Aortic Atherosclerosis (ICD10-I70.0). 5. Bilateral L5 pars defects. Electronically Signed   By: Delbert PhenixJason A Poff M.D.   On: 12/17/2017 16:04   Dg Chest 2 View  Result Date: 12/17/2017 CLINICAL DATA:  Shortness of breath, correlation with V/Q scan EXAM: CHEST - 2 VIEW  COMPARISON:  None FINDINGS: Borderline enlargement of cardiac silhouette. Mediastinal contours and pulmonary vascularity normal. Lungs clear. No pleural effusion or pneumothorax. Bones demineralized. IMPRESSION: No acute abnormalities. Electronically Signed   By: Ulyses Southward M.D.   On: 12/17/2017 17:42   Nm Pulmonary Vent And Perf (v/q Scan)  Result Date: 12/17/2017 CLINICAL DATA:  Elevated D-dimer, intermediate clinical probability of pulmonary embolism EXAM: NUCLEAR MEDICINE VENTILATION - PERFUSION LUNG SCAN TECHNIQUE: Ventilation images were obtained in multiple projections using inhaled aerosol Tc-83m DTPA. Perfusion images were obtained in multiple projections after intravenous injection of Tc-58m-MAA. RADIOPHARMACEUTICALS:  32.0 mCi of Tc-106m DTPA aerosol inhalation and 4.3 mCi Tc52m-MAA IV COMPARISON:  None FINDINGS: Ventilation: Central airway deposition of aerosol. Poor aerosol delivery to the lungs bilaterally limiting assessment. Areas of patchy diminished ventilation in both lungs matching perfusion findings. Perfusion: Small subsegmental perfusion  defects at the lateral LEFT upper lobe and lingula as well as laterally in the RIGHT middle lobe. Chest radiograph: Upper normal heart size.  No gross infiltrates. IMPRESSION: Low probability for pulmonary embolism. Electronically Signed   By: Ulyses Southward M.D.   On: 12/17/2017 17:38    Procedures Procedures (including critical care time)  Medications Ordered in ED Medications  technetium TC 82M diethylenetriame-pentaacetic acid (DTPA) injection 30 millicurie (has no administration in time range)  HYDROmorphone (DILAUDID) injection 1 mg (1 mg Intravenous Given 12/17/17 1423)  ondansetron (ZOFRAN) injection 4 mg (4 mg Intravenous Given 12/17/17 1423)  technetium TC 82M diethylenetriame-pentaacetic acid (DTPA) injection 30 millicurie (30 millicuries Inhalation Given 12/17/17 1603)     Initial Impression / Assessment and Plan / ED Course  I have reviewed the triage vital signs and the nursing notes.  Pertinent labs & imaging results that were available during my care of the patient were reviewed by me and considered in my medical decision making (see chart for details).    Patient with a history of takotsobo cardiomyopathy, Parkinson's disease, pineal gland cyst presents to emergency department for evaluation of left upper quadrant pain.  Worsened with taking a deep breath.  She also reports some associated shortness of breath.  Of note, she was recently discharged from the hospital 6/29. On exam she is afebrile and nontoxic-appearing.  Acutely tender to palpation the left upper quadrant.  Lungs clear to auscultation.  Vital signs stable.  O2 sat 98% on room air.  Labs reviewed.  CBC unremarkable, no leukocytosis.  CMP without any major electrolyte abnormalities, creatinine and liver enzymes WNL.  Lipase negative, UA without evidence of infection.  Will go ahead and get CT abdomen/pelvis to further evaluate her left upper quadrant pain, this could potentially be an early diverticulitis.  Will also get  d-dimer, given she complains of pleuritic chest pain, shortness of breath and was recently hospitalized.  D-dimer positive (1.23), will get VQ scan to evaluate for PE given patient has anaphylaxis with contrast dye and cannot get CT angio chest.  CT abdomen/pelvis without acute abnormality.  Does show constipation and small 0.5cm renal cortical lesion in the posterior upper left kidney, likely tiny hemorrhagic/proteinaceous renal cyst (no follow up required according to radiologist.)    V/Q scan negative for PE.  On recheck patient reports her pain is improved.  I have discussed reassuring results.  Repeat abdominal exam soft, no concern for acute surgical abdomen.  Have counseled her on use of Tylenol at home for symptomatic management.  Discussed reasons to return to the emergency department and she agrees.  Her questions were  answered and she has no complaints prior to discharge.  Tolerating p.o. fluids and eating at the bedside.  This was a shared visit with Dr. Gustavus Messing who also saw the patient and agrees with plan and discharge home.  Final Clinical Impressions(s) / ED Diagnoses   Final diagnoses:  LUQ pain    ED Discharge Orders    None       Lawrence Marseilles 12/17/17 1912    Vanetta Mulders, MD 12/19/17 [phone]

## 2017-12-17 NOTE — Progress Notes (Signed)
12/17/2017 Sabrina Crawford   Oct 08, 1955  [id number]  Primary Physician Leilani Able, MD Primary Cardiologist: Dr Jens Som  HPI:  62 y.o. divorced female who recently moved her from [place] to get established with [facility] for treatment of Parkinson's. She was admitted with a NSTEMI following an allergic reaction to gadolinium during an MRI to evl;autae a pineal cyst.  She developed SSCP and EKG changes. Her Troponin was elevated.  Cardiac catheterization was done 12/06/17 and revealed a 40-50 LAD and LV dysfunction consistent with takotsubo cardiomyopathy. Her EF was 25-30% with apical AK. She was hypotensive in the hospital and medications were limited. She is in the office today for follow up. She denies any chest pain or SOB. She did develop LUQ abdominal pain and tenderness 3 days ago, no nausea or vomiting.    Current Outpatient Medications  Medication Sig Dispense Refill  . aspirin EC 81 MG EC tablet Take 1 tablet (81 mg total) by mouth daily.    Marland Kitchen atorvastatin (LIPITOR) 20 MG tablet Take 1 tablet (20 mg total) by mouth daily at 6 PM. 30 tablet 5  . carbidopa-levodopa (SINEMET IR) 25-100 MG tablet Take 1 tablet by mouth 5 (five) times daily.     . Carbidopa-Levodopa ER (SINEMET CR) 25-100 MG tablet controlled release Take 1 tablet by mouth 2 (two) times daily.  11  . carvedilol (COREG) 3.125 MG tablet Take 1 tablet (3.125 mg total) by mouth 2 (two) times daily with a meal. 60 tablet 5  . esomeprazole (NEXIUM) 40 MG capsule Take 40 mg by mouth every morning.  6  . furosemide (LASIX) 20 MG tablet Take 1 tablet (20 mg total) by mouth daily as needed. For fluid or weight gain greater than 2 pounds overnight. 30 tablet 5  . Melatonin 5 MG TABS Take 5 mg by mouth at bedtime.    . Multiple Vitamins-Minerals (HM MULTIVITAMIN ADULT GUMMY PO) Take 2 each by mouth daily at 6 (six) AM.    . omega-3 acid ethyl esters (LOVAZA) 1 g capsule Take 1 g by mouth daily at 2 PM.    . PROAIR HFA 108 (90  Base) MCG/ACT inhaler Inhale 1 puff into the lungs every 4 (four) hours as needed for shortness of breath or wheezing.  4  . TURMERIC PO Take 1 capsule by mouth daily at 6 PM.    . venlafaxine XR (EFFEXOR-XR) 75 MG 24 hr capsule Take 75 mg by mouth daily at 2 PM.  11  . vitamin E (VITAMIN E) 1000 UNIT capsule Take 1,000 Units by mouth at bedtime.    . valsartan (DIOVAN) 40 MG tablet Take 0.5 tablets (20 mg total) by mouth at bedtime. 30 tablet 3   No current facility-administered medications for this visit.     Allergies  Allergen Reactions  . Chamomile Shortness Of Breath  . Gadolinium Derivatives Anaphylaxis    12/06/17 After receiving Multihance: severe lip swelling, redness, laryngeal edema; to ED via EMS  . Iodinated Diagnostic Agents Anaphylaxis    IV contrast  . Latex Anaphylaxis  . Methocarbamol Shortness Of Breath    Past Medical History:  Diagnosis Date  . Acute anaphylaxis    To contrast dye  . Blood transfusion without reported diagnosis   . Parkinson's disease (HCC)   . Pineal gland cyst   . Takotsubo cardiomyopathy    a. 11/2017: NSTEMI in the setting of allergic reaction with cath showing 40-50% LAD stenosis and EF reduced to 25-30%  Social History   Socioeconomic History  . Marital status: Divorced    Spouse name: Not on file  . Number of children: Not on file  . Years of education: Not on file  . Highest education level: Not on file  Occupational History  . Not on file  Social Needs  . Financial resource strain: Not on file  . Food insecurity:    Worry: Not on file    Inability: Not on file  . Transportation needs:    Medical: Not on file    Non-medical: Not on file  Tobacco Use  . Smoking status: Former Smoker    Last attempt to quit: 2000    Years since quitting: 19.5  . Smokeless tobacco: Never Used  . Tobacco comment: 1pk/week.   Substance and Sexual Activity  . Alcohol use: Yes    Alcohol/week: 1.2 oz    Types: 2 Glasses of wine per  week    Comment: occasionally   . Drug use: Never  . Sexual activity: Not on file  Lifestyle  . Physical activity:    Days per week: Not on file    Minutes per session: Not on file  . Stress: Not on file  Relationships  . Social connections:    Talks on phone: Not on file    Gets together: Not on file    Attends religious service: Not on file    Active member of club or organization: Not on file    Attends meetings of clubs or organizations: Not on file    Relationship status: Not on file  . Intimate partner violence:    Fear of current or ex partner: Not on file    Emotionally abused: Not on file    Physically abused: Not on file    Forced sexual activity: Not on file  Other Topics Concern  . Not on file  Social History Narrative  . Not on file     Family History  Problem Relation Age of Onset  . Heart failure Mother   . Heart failure Father      Review of Systems: General: negative for chills, fever, night sweats or weight changes.  Cardiovascular: negative for chest pain, dyspnea on exertion, edema, orthopnea, palpitations, paroxysmal nocturnal dyspnea or shortness of breath Dermatological: negative for rash Respiratory: negative for cough or wheezing Urologic: negative for hematuria Abdominal: negative for nausea, vomiting, diarrhea, bright red blood per rectum, melena, or hematemesis Neurologic: negative for visual changes, syncope, or dizziness All other systems reviewed and are otherwise negative except as noted above.    Blood pressure 124/64, pulse 61, height 5\' 3"  (1.6 m), weight 188 lb (85.3 kg), SpO2 94 %.  General appearance: alert, cooperative, no distress and mildly obese Neck: no carotid bruit and no JVD Lungs: clear to auscultation bilaterally Heart: regular rate and rhythm and 1/6 short SEM Abdomen: not distended, tender LUQ to palpation- ? splenomegaly.  Pulses: 2+ and symmetric Skin: Skin color, texture, turgor normal. No rashes or  lesions Neurologic: Grossly normal   ASSESSMENT AND PLAN:    Takotsubo syndrome NSTEMI 12/06/17 after being treated for an allergic reaction 12/06/17 EF 25-30% with apical AK  Non-insulin dependent type 2 diabetes mellitus (HCC) Hgb A1c-6.6. During her admission 12/06/17- on no medications  Allergy to imaging contrast media Severe reaction to MRI contrast 12/06/17-(pineal cyst)  Parkinsons (HCC) Being followed at [facility] frequently History of falls, uses a cane  Abdominal pain Pt c/o LUQ abdominal pain  and tenderness-started 3 days ago.     PLAN  Repeat B/P by me 114/82- I added Valsartan 20 mg Q HS. F/U in 6 weeks and schedule echo then. She has an appointment with Dr Pecola Leisureeese today- will defer work up of her LUQ to Dr Pecola Leisureeese.   Corine ShelterLuke Layann Bluett PA-C 12/17/2017 9:23 AM

## 2017-12-17 NOTE — ED Notes (Signed)
Pt verbalizes understanding of d/c instructions. Pt ambulatory at d/c with all belongings and with family.   

## 2017-12-17 NOTE — Assessment & Plan Note (Signed)
Pt c/o LUQ abdominal pain and tenderness-started 3 days ago.

## 2017-12-24 ENCOUNTER — Encounter: Payer: Self-pay | Admitting: Physical Therapy

## 2017-12-24 ENCOUNTER — Other Ambulatory Visit: Payer: Self-pay

## 2017-12-24 ENCOUNTER — Ambulatory Visit: Payer: Medicaid Other | Attending: Family Medicine | Admitting: Physical Therapy

## 2017-12-24 DIAGNOSIS — R293 Abnormal posture: Secondary | ICD-10-CM | POA: Diagnosis present

## 2017-12-24 DIAGNOSIS — Z9181 History of falling: Secondary | ICD-10-CM

## 2017-12-24 DIAGNOSIS — R262 Difficulty in walking, not elsewhere classified: Secondary | ICD-10-CM

## 2017-12-24 DIAGNOSIS — M6281 Muscle weakness (generalized): Secondary | ICD-10-CM

## 2017-12-24 NOTE — Therapy (Signed)
[facility] [address] [place], [place], [address] Phone: [phone]   Fax:  [phone]  Physical Therapy Treatment  Patient Details  Name: Sabrina Crawford MRN: [id number] Date of Birth: 1955-08-22 Referring Provider: Leilani Able MD   Encounter Date: 12/24/2017  PT End of Session - 12/24/17 1617    Visit Number  1    Number of Visits  15    Date for PT Re-Evaluation  -- TBA by Medicaid    Authorization Type  Medicaid    Authorization Time Period  TBD by Medicaid    Authorization - Visit Number  0    Authorization - Number of Visits  3    PT Start Time  0803    PT Stop Time  0850    PT Time Calculation (min)  47 min    Activity Tolerance  Patient tolerated treatment well    Behavior During Therapy  [facility] for tasks assessed/performed       Past Medical History:  Diagnosis Date  . Acute anaphylaxis    To contrast dye  . Blood transfusion without reported diagnosis   . Parkinson's disease (HCC)   . Pineal gland cyst   . Takotsubo cardiomyopathy    a. 11/2017: NSTEMI in the setting of allergic reaction with cath showing 40-50% LAD stenosis and EF reduced to 25-30%     Past Surgical History:  Procedure Laterality Date  . ABDOMINAL HYSTERECTOMY    . APPENDECTOMY    . BREAST SURGERY    . CHOLECYSTECTOMY    . LEFT HEART CATH AND CORONARY ANGIOGRAPHY N/A 12/06/2017   Procedure: LEFT HEART CATH AND CORONARY ANGIOGRAPHY;  Surgeon: Lyn Records, MD;  Location: [facility] INVASIVE CV LAB;  Service: Cardiovascular;  Laterality: N/A;    There were no vitals filed for this visit.  Subjective Assessment - 12/24/17 0805    Subjective  In October, fell in [place] and landed on her knee. Hurts to bend, straighten, and bear weight.  PCP wanted to see if PT could help and did not refer her to an orthopedist. She moved here in March to live with her daughter and does not have another MD. I used to walk alot. When I fall it's usually  when I'm moving too fast. Always forwards and to my left.     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  Yes    Pain Score  8     Pain Location  Knee    Pain Orientation  Posterior;Left    Pain Descriptors / Indicators  Aching    Pain Type  Chronic pain    Pain Onset  More than a month ago    Pain Frequency  Constant    Aggravating Factors   standing; walking    Pain Relieving Factors  elevate; Using compression sleeve.    Effect of Pain on Daily Activities  unable to move as much as before; unable to exercise         [facility] PT Assessment - 12/24/17 6962  Assessment   Medical Diagnosis  Lt knee pain; Parkinson's Syndrome; gait abnormality has since been diagnosed with Progressive Supranuclear Palsy    Referring Provider  Leilani Able MD    Onset Date/Surgical Date  -- October 2018    Prior Therapy  none for knee      Precautions   Precautions  Fall      Restrictions   Weight Bearing Restrictions  No      Balance Screen   Has the patient fallen in the past 6 months  Yes    How many times?  at least 1-2 times per week. turning too fast in the kitchen     Has the patient had a decrease in activity level because of a fear of falling?   Yes    Is the patient reluctant to leave their home because of a fear of falling?   Yes and because of my speech       Home Environment   Living Environment  Private residence    Living Arrangements  Children    Available Help at Discharge  Family    Type of Home  House    Home Access  Stairs to enter    Entrance Stairs-Number of Steps  5     Entrance Stairs-Rails  Right    Home Layout  One level    Home Equipment  Walker - 4 wheels;Cane - single point;Grab bars - tub/shower;Hand held shower head    Additional Comments  using rollator at all times      Prior Function   Level of Independence  Needs assistance with ADLs    Vocation  On disability medical records--was let go    Comments  asssist with bathing and dressing; has an aide that comes 3x/week for 2 hours      Cognition   Overall Cognitive Status  Within Functional Limits for tasks assessed    Behaviors  Other (comment) depression      Sensation   Light Touch  Impaired by gross assessment    Additional Comments  in morning my feet are like rocks; heavy and cannot move; take my medicine before I even get up; I do a lot of stretching and then they seem better      Coordination   Gross Motor Movements are Fluid and Coordinated  No    Fine Motor Movements are Fluid and Coordinated  No      Posture/Postural Control   Posture/Postural Control  Postural limitations    Postural Limitations  Rounded Shoulders;Forward head;Flexed trunk      ROM / Strength   AROM / PROM / Strength  AROM;PROM;Strength      AROM   Overall AROM   Deficits    Overall AROM Comments  Lt knee 4-96 flexion limited by pain in posterior knee      Strength   Overall Strength  Deficits    Strength Assessment Site  Hip;Knee;Ankle    Right/Left Hip  Right;Left    Right Hip Flexion  4/5    Right Hip ABduction  3+/5    Left Hip Flexion  3+/5    Left Hip Extension  3/5    Left Hip ABduction  2+/5    Right/Left Knee  Right;Left    Right Knee Flexion  5/5    Right Knee Extension  4/5    Left Knee Flexion  3/5    Left Knee Extension  3/5    Right/Left Ankle  Right;Left  Right Ankle Dorsiflexion  4+/5    Left Ankle Dorsiflexion  4+/5      Bed Mobility   Bed Mobility  Sit to Supine;Rolling Right;Rolling Left;Left Sidelying to Sit    Rolling Right  Independent    Rolling Left  Independent     Left Sidelying to Sit  Supervision/Verbal cueing close to edge, supervision for safety    Sit to Supine  Supervision/Verbal cueing near need for assist to raise LLE      Transfers   Five time sit to stand comments   36.9 12.7      Ambulation/Gait   Ambulation/Gait  Yes    Ambulation/Gait Assistance  6: Modified independent (Device/Increase time)    Ambulation Distance (Feet)  40 Feet 150, 30, 80    Assistive device  Rollator    Gait Pattern  Step-through pattern;Decreased step length - right;Decreased step length - left;Trunk flexed;Narrow base of support;Poor foot clearance - left;Poor foot clearance - right    Ambulation Surface  Level;Indoor    Gait velocity  32.8/16.06=2.51      Standardized Balance Assessment   Standardized Balance Assessment  Timed Up and Go Test      Timed Up and Go Test   Normal TUG (seconds)  18.66 no device; 23.69 rollator                           PT Education - 12/24/17 1616    Education Details  results of evaluation; proper height for handles of rollator and proper/safe use of rollator; process for Medicaid approval and PT POC    Person(s) Educated  Patient    Methods  Explanation    Comprehension  Verbalized understanding          PT Long Term Goals - 12/24/17 1940      PT LONG TERM GOAL #1   Title  Patient will be independent with HEP that does not contribute to incr knee pain and addresses decreased balance. (Target all LTGs by 3rd visit--date TBD by Medicaid approval dates)    Baseline  Currently not exercising due to left knee pain    Time  3    Period  Weeks    Status  New      PT LONG TERM GOAL #2   Title  Patient will demonstrate safe, proper use of LRAD to ambulate 131ft modified indpendent.     Baseline  Requries max vc to properly use rollator.     Time  3    Period  Weeks    Status  New      PT LONG TERM GOAL #3   Title  Patient will demonstrate lesser fall risk with gait velocity >=2.62 ft/sec.      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 During a fall in October 2018, pt injured left knee and has had posterior knee pain since that time. Her risk of repeated falls is significant as reflected by her TUG normal 18.66 sec with no device; 23.69 sec with rollator (>13.5 sec indicative of  increased fall risk). Her gait velocity of 2.51 ft/sec indicates she is at increased risk of falling during community ambuation.. Patient will need further work-up by MD to determine diagnosis/source of knee pain. She reports she falls at least 1-2x/wk. At this time, pt can benefit from PT to improve safe use of DME to reduce fall risk, reduce risk of further injury, and potentially decrease left knee pain.  Patient can benefit from the PT interventions listed below to address developing a home exercise program patient can perform without increasing knee pain as she has been unable to participate in her group exercise classes.     History and Personal Factors relevant to plan of care:  PMH-takotsubo cardiomyopathy, Parkinson's disease vs Progressive supranuclear palsy, pineal gland cyst, anaphylaxis due to contrast dye with NSTEM (hospitalized 6/27-6/29); ED 7/8 LUQ pain (CT abd & V/Q scan negative); DM  Personal factors- no longer drives and is dependent on others for transport to PT.     Clinical Presentation  Evolving    Clinical Presentation due to:  progressive neurological disorder; Lt knee pain of unknown cause/diagnosis    Clinical Decision Making  Moderate    Rehab Potential  Fair    Clinical Impairments Affecting Rehab Potential  unknown cause/diagnosis of left knee pain;  progressive neurological disorder     PT Frequency  1x / week    PT Duration  3 weeks with plan to recertify for additional visits if appropriate based on findings of further knee work-up    PT Treatment/Interventions  ADLs/Self Care Home Management;Aquatic Therapy;Gait training;DME Instruction;Functional mobility training;Therapeutic activities;Therapeutic exercise;Balance training;Neuromuscular re-education;Patient/family education;Passive range of motion;Energy conservation    PT Next Visit Plan  educate on safe use of rollator and SPC; Initiate HEP with PWR! supine, prone, sitting;  ?corner exercises for balance    Recommended Other Services  ?OT consult (to further discuss with patient)    Consulted and Agree with Plan of Care  Patient       Patient will benefit from skilled therapeutic intervention in order to improve the following deficits and impairments:  Abnormal gait, Decreased activity tolerance, Decreased balance, Decreased mobility, Decreased knowledge of use of DME, Decreased range of motion, Decreased safety awareness, Decreased strength, Impaired flexibility, Postural dysfunction, Pain, Obesity  Visit Diagnosis: Abnormal posture - Plan: PT plan of care cert/re-cert  History of falling - Plan: PT plan of care cert/re-cert  Muscle weakness (generalized) - Plan: PT plan of care cert/re-cert  Difficulty in walking, not elsewhere classified - Plan: PT plan of care cert/re-cert     Problem List Patient Active Problem List   Diagnosis Date Noted  . Allergy to imaging contrast media 12/17/2017  . Abdominal pain 12/17/2017  . Chest pain 12/06/2017  . Elevated troponin I level 12/06/2017  . Takotsubo syndrome 12/06/2017  . Non-insulin dependent type 2 diabetes mellitus (HCC) 11/23/2017  . Falls frequently 11/23/2017  . 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 Parkinsons (HCC) 11/23/2017    Zena AmosLynn P Samuel Mcpeek, PT 12/24/2017, 8:10 PM  [facility] [address] [place], [place], [address] Phone: [phone]   Fax:  [phone]  Name: Sabrina BienSusanne Crawford MRN: [id number] Date of Birth: 08/17/1955

## 2017-12-26 ENCOUNTER — Encounter: Payer: Self-pay | Admitting: Sports Medicine

## 2017-12-26 ENCOUNTER — Ambulatory Visit: Payer: Medicaid Other | Admitting: Sports Medicine

## 2017-12-26 VITALS — BP 118/69 | Ht 63.0 in | Wt 183.0 lb

## 2017-12-26 DIAGNOSIS — G20 Parkinson's disease: Secondary | ICD-10-CM | POA: Diagnosis not present

## 2017-12-26 DIAGNOSIS — S8992XA Unspecified injury of left lower leg, initial encounter: Secondary | ICD-10-CM | POA: Diagnosis present

## 2017-12-26 DIAGNOSIS — M25562 Pain in left knee: Secondary | ICD-10-CM

## 2017-12-26 NOTE — Progress Notes (Signed)
Subjective:    Patient ID: Sabrina Crawford, female    DOB: [birth year], 62 y.o.   MRN: [id number]  Patient is a 62yo female with a pmh significant for Parkinson's disease on carvidopa/levodopa therapy who presents to the sports clinic with the cc of L knee injury. Last October, she was at a [employer] and tried to take a step with her cane. Her L knee buckled outward with a varus stress/twisting motion. An audible pop was heard. She required assistance to get up afterwards 2/2 to pain. That night, she noticed swelling. She tried placing ice over the area and resting without relief. This occurred in [place]. She does have a hx of a R medial meniscal tear s/p arthroscopic debridement and menisectomy. She states her L knee feels like that.  Today, she states her pain is constant rated 7-8/10 in nature. It is localized to the lateral aspect of the knee and posteriorly. She does have feelings of instability with frequent locking of her left leg. She has fallen 3 x since the initial event. Denies any numbness or tingling. She does have baseline gait instability d/t her Parkinsons disease. Denies any hip or groin pain. No ankle pain.   ROS detailed above in HPI.   PMH: Parkinsons disease, HLP, GERD, Takotsubo cardiomyopathy, HFrEF PSH: reviewed. Pertinent include R knee arthroscopy with partial menisectomy Allergies: Latex, Robaxin, Contrast, Gadolinium, chamomile FH: Heart failure Social: retired, recently moved from [place] to live with daughter, former smoker      Objective:     Vitals: BP 118/69, BMI 32.42 General: Well appearing, NAD Head: [place]/AT Eyes: Conjunctiva clear Mouth: MMM Cardiac: R radial pulse 2/4 Lungs: Breathing unlabored Skin: no rashes or skin lesions Neuro: nonfocal Back: No acute abnormalities Gait: normal MSK: Knee, L: Inspection was negative for erythema, negative for effusion, and negative for obvious bony abnormalities. Palpation was positive for joint line  tenderness laterally, negative for condyle tenderness, negative for patellar tenderness, negative for patellar crepitus, and negative for tenderness of the pes anserine bursa. Lateral tenderness noted at distal attachment of LCL. Patellar and quadriceps tendons intact. ROM flexion (110 degrees) and extension (0 degrees). Normal hamstring and quadriceps strength. Neurovascularly intact bilaterally.  - Ligaments: (Solid and consistent endpoints)   - ACL (present bilaterally)   - PCL (present bilaterally)   - LCL pain with palpation although intact. Pain with Varus test although itnact.   - MCL (present bilaterally).   - Additional tests performed:    - Anterior Drawer >> Neg   - Lachman >> Meg  - Meniscus:   - Thessaly: unable to perform 2/2 to instability   - McMurray's: positive with pain on lateral joint line  - Patella:   - Patellar grind/compression: neg       Assessment & Plan:  1. L Knee injury, suspect knee internal derangement 2. LCL sprain, suspected 3. Gait instability, d/t Parkinson's disease  Given the patient's hx and physical exam, I do believe she suffered a probable L knee lateral meniscal injury and an LCL sprain from her traumatic fall. I did review her prior knee xrays from 3 months ago which demonstrate only mild osteoarthritis which does not explain her pain. I am recommending pursuing further testing with an MRI of the L knee. I will follow up in 1-2 weeks afterwards to discuss the results of this. In the meantime, I did fit her with a hinged knee brace. After reviewing her medications, she may take 400-600mg  Ibuprofen as needed for  pain 3 times daily. I also recommend relative rest, ice, and elevation. She may continue to ambulate with a walker/cane for stability. Her questions were answered. She was discharged in stable condition from the office with follow up and instructions.  Gustavus MessingAJ Pinney, DO Sports Medicine Fellow [facility]

## 2017-12-26 NOTE — Patient Instructions (Addendum)
Left knee injury: I suspect that you have an injury to your lateral meniscus (cartilage) of the knee. You may also have sprained your lateral collateral ligament on the outside of your knee. To better diagnose this, I am recommending ordering an MRI. It is set up for next Thursday. I will see you afterwards within 1-2 weeks to discuss the results. I will place you in a hinged knee brace until then. You may take Motrin if you are able to for pain until then and ice/elevate your knee.

## 2018-01-01 ENCOUNTER — Encounter: Payer: Medicaid Other | Admitting: Occupational Therapy

## 2018-01-03 ENCOUNTER — Ambulatory Visit: Payer: Medicaid Other | Admitting: Physical Therapy

## 2018-01-03 ENCOUNTER — Encounter: Payer: Self-pay | Admitting: Physical Therapy

## 2018-01-03 ENCOUNTER — Other Ambulatory Visit: Payer: Medicaid Other

## 2018-01-03 DIAGNOSIS — M6281 Muscle weakness (generalized): Secondary | ICD-10-CM

## 2018-01-03 DIAGNOSIS — Z9181 History of falling: Secondary | ICD-10-CM

## 2018-01-03 DIAGNOSIS — R293 Abnormal posture: Secondary | ICD-10-CM | POA: Diagnosis not present

## 2018-01-03 NOTE — Therapy (Signed)
[facility] [address] [place], [place], [address] Phone: [phone]   Fax:  [phone]  Physical Therapy Treatment  Patient Details  Name: Sabrina Crawford MRN: [id number] Date of Birth: 12-01-1955 Referring Provider: Leilani Able MD   Encounter Date: 01/03/2018  PT End of Session - 01/03/18 1847    Visit Number  2 includes eval    Number of Visits  15    Date for PT Re-Evaluation  01/23/18 TBA by Medicaid    Authorization Type  Medicaid    Authorization Time Period  7/25 to 01/23/18 per Medicaid approval    Authorization - Visit Number  1    Authorization - Number of Visits  3    PT Start Time  1447    PT Stop Time  1532    PT Time Calculation (min)  45 min    Activity Tolerance  Patient tolerated treatment well    Behavior During Therapy  [facility] for tasks assessed/performed       Past Medical History:  Diagnosis Date  . Acute anaphylaxis    To contrast dye  . Blood transfusion without reported diagnosis   . Parkinson's disease (HCC)   . Pineal gland cyst   . Takotsubo cardiomyopathy    a. 11/2017: NSTEMI in the setting of allergic reaction with cath showing 40-50% LAD stenosis and EF reduced to 25-30%     Past Surgical History:  Procedure Laterality Date  . ABDOMINAL HYSTERECTOMY    . APPENDECTOMY    . BREAST SURGERY    . CHOLECYSTECTOMY    . LEFT HEART CATH AND CORONARY ANGIOGRAPHY N/A 12/06/2017   Procedure: LEFT HEART CATH AND CORONARY ANGIOGRAPHY;  Surgeon: Lyn Records, MD;  Location: [facility] INVASIVE CV LAB;  Service: Cardiovascular;  Laterality: N/A;    There were no vitals filed for this visit.  Subjective Assessment - 01/03/18 1451    Subjective  Fell one week ago. In the garden watering plants and went down fast. Thinks she passed out with no warning signs. Larey Seat forward and did not even get her hands up to break her fall with left side of her face/head bruised. She reports she did not go to the  doctor as she didn't think she was seriously hurt. A friend was with her and able to help her up off the ground. Saw the Sports Medicine doctor and will have MRI on left knee tomorrow 7/26. Knee is much more sore since his evaluation. (per MD note, pt is to 'rest, ice, elevate' her leg until MRI results are known).. Patient with request to review bed mobility as she has great difficulty getting into and out of her tall bed.     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  Yes    Pain Score  3     Pain Location  Eye    Pain Orientation  Left    Pain Descriptors / Indicators  Tender    Pain Type  Acute pain    Pain Onset  In the  past 7 days    Pain Frequency  Intermittent                       [facility] Adult PT Treatment/Exercise - 01/03/18 1837      Bed Mobility   Bed Mobility  Rolling Right;Rolling Left;Left Sidelying to Sit;Sitting - Scoot to Edge of Bed;Sit to Supine;Sit to Sidelying Left    Rolling Right  Independent    Rolling Left  Independent    Left Sidelying to Sit  Supervision/Verbal cueing to reach rt hip to bed, "rainbow" with her torso with head      Sitting - Scoot to Edge of Bed  Independent    Sit to Supine  Supervision/Verbal cueing using her cane handle as a leg-lifter for RLE    Sit to Sidelying Left  Supervision/Verbal cueing      Transfers   Transfers  Sit to Stand;Stand to Sit    Sit to Stand  6: Modified independent (Device/Increase time);With upper extremity assist    Stand to Sit  6: Modified independent (Device/Increase time);With upper extremity assist     Comments  no posterior bias noted      Ambulation/Gait   Ambulation/Gait  Yes    Ambulation/Gait Assistance  6: Modified independent (Device/Increase time)    Ambulation Distance (Feet)  40 Feet x2 (to 1st exam room only due to left knee pain)    Assistive device  Straight cane    Gait Pattern  Step-through pattern;Decreased step length - right;Decreased step length - left;Trunk flexed;Poor foot clearance - left;Poor foot clearance - right;Decreased stance time - left;Decreased hip/knee flexion - left;Decreased weight shift to left;Left foot flat    Ambulation Surface  Indoor      Bed mobility (into and out of bed) repeated x 4 to develop best method for patient.   NOTE: focus of session had to be changed due to increased knee pain and MD reporting she should rest, ice, elevate until MRI done and results known. Patient requesting assist to improve her safety getting into and out of her bed (high bed, stepping forward onto step stool and then crawling onto bed with "flop" onto her side to lie down.        PT Education - 01/03/18 1845    Education Details  Patient able to verbalize that she has been diagnosed with PSP (originally thought to have PD, but recently seen at [facility] with PSP diagnosed). She verbalizes that she understands it is a progressive disease. see also pt instructions for education re: bed mobilty; educated pt that she should be using rollator (not SPC, which she walked into clinic with) to lessen stress on left knee and decr fall risk; pt requesting MD order for tub seat (has a sliding door at her tub/shower and discussed tub bench will not work with the doors). Pt required simulation of her shower to understand why the tub bench will not work with the sliding doors she has.   Person(s) Educated  Patient    Methods  Explanation;Demonstration    Comprehension  Returned demonstration          PT Long Term Goals - 01/03/18 1859      PT LONG TERM GOAL #1   Title  Patient  will be independent with HEP that does not contribute to incr knee pain and addresses decreased balance. (Target all LTGs by 3rd visit--date TBD by Medicaid approval dates)    Baseline  Currently not exercising  due to left knee pain; 7/25 to have MRI 7/26 and await results/MD clearance    Time  3    Period  Weeks    Status  On-going      PT LONG TERM GOAL #2   Title  Patient will demonstrate safe, proper use of LRAD to ambulate 12520ft modified indpendent.     Baseline  Requries max vc to properly use rollator.     Time  3    Period  Weeks    Status  New      PT LONG TERM GOAL #3   Title  Patient will demonstrate lesser fall risk with gait velocity >=2.62 ft/sec.     Baseline  Current velocity = 2.51 ft/sec    Time  3    Period  Weeks    Status  New            Plan - 01/03/18 1849    Clinical Impression Statement  Patient arrives with new, hinged brace on Left knee after seen by Sports Medicine MD with instructions to rest, ice, and elevate her leg until results of MRI (to be done 01/04/18). She did however have concerns re: falling out of bed and most safe way to get into and out of her bed. (See pt instructions). Patient very appreciative of education. Patient is to see Dr. Laureen OchsPinney prior to her next scheduled PT appt and will call to cancel this appointment if Dr. Laureen OchsPinney does not clear her to continue PT>     Rehab Potential  Fair    Clinical Impairments Affecting Rehab Potential  unknown cause/diagnosis of left knee pain; progressive neurological disorder     PT Frequency  1x / week    PT Duration  3 weeks with plan to recertify for additional visits if appropriate based on findings of further knee work-up    PT Treatment/Interventions  ADLs/Self Care Home Management;Aquatic Therapy;Gait training;DME Instruction;Functional mobility training;Therapeutic activities;Therapeutic exercise;Balance training;Neuromuscular re-education;Patient/family education;Passive range of motion;Energy  conservation    PT Next Visit Plan  educate on safe use of rollator; Initiate HEP with PWR! supine, prone, sitting;  ?corner exercises for balance    Consulted and Agree with Plan of Care  Patient       Patient will benefit from skilled therapeutic intervention in order to improve the following deficits and impairments:  Abnormal gait, Decreased activity tolerance, Decreased balance, Decreased mobility, Decreased knowledge of use of DME, Decreased range of motion, Decreased safety awareness, Decreased strength, Impaired flexibility, Postural dysfunction, Pain, Obesity  Visit Diagnosis: History of falling  Muscle weakness (generalized)     Problem List Patient Active Problem List   Diagnosis Date Noted  . Allergy to imaging contrast media 12/17/2017  . Abdominal pain 12/17/2017  . Chest pain 12/06/2017  . Elevated troponin I level 12/06/2017  . Takotsubo syndrome 12/06/2017  . Non-insulin dependent type 2 diabetes mellitus (HCC) 11/23/2017  . Falls frequently 11/23/2017  . 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 Parkinsons (HCC) 11/23/2017    Zena AmosLynn P Marlean Mortell, PT 01/03/2018, 7:07 PM  [facility] [address] [place], [place], [address] Phone: [phone]   Fax:  [phone]  Name: Sabrina Crawford MRN: [id number] Date of Birth: 03/27/1956

## 2018-01-03 NOTE — Patient Instructions (Signed)
   1. See if you can remove your boxspring.  2. Or if you need to have a boxspring, can you get a new short boxspring?  3. In the meantime, face your chair (so you can hold on to it) and step up sideways on to the step stool. Your back will be towards the bed and you can then sit down.   4. Try to lie down sideways onto your left side and if your legs are too tired to lift onto the bed, use the hook of your cane and place it under the inside part of your right foot and lift the leg onto the bed. If the cane does not work, you can try your [employer] ropes or a flat sheet to "lasso" your foot.   5. When getting out of the bed from your side, put your legs over the side and then relax your head, pushing up with the arms while your torso "makes a rainbow" and your head relaxes and comes up last.

## 2018-01-04 ENCOUNTER — Ambulatory Visit
Admission: RE | Admit: 2018-01-04 | Discharge: 2018-01-04 | Disposition: A | Discharge status: Home / Self Care | Payer: Medicaid Other | Source: Ambulatory Visit | Attending: Sports Medicine | Admitting: Sports Medicine

## 2018-01-04 DIAGNOSIS — M25562 Pain in left knee: Secondary | ICD-10-CM

## 2018-01-09 ENCOUNTER — Encounter: Payer: Self-pay | Admitting: Sports Medicine

## 2018-01-09 ENCOUNTER — Ambulatory Visit (INDEPENDENT_AMBULATORY_CARE_PROVIDER_SITE_OTHER): Payer: Medicaid Other | Admitting: Sports Medicine

## 2018-01-09 VITALS — BP 136/74 | Ht 63.0 in | Wt 181.0 lb

## 2018-01-09 DIAGNOSIS — S8992XA Unspecified injury of left lower leg, initial encounter: Secondary | ICD-10-CM

## 2018-01-09 DIAGNOSIS — G20 Parkinson's disease: Secondary | ICD-10-CM

## 2018-01-09 DIAGNOSIS — M23322 Other meniscus derangements, posterior horn of medial meniscus, left knee: Secondary | ICD-10-CM

## 2018-01-09 DIAGNOSIS — M1712 Unilateral primary osteoarthritis, left knee: Secondary | ICD-10-CM

## 2018-01-09 NOTE — Patient Instructions (Signed)
We have scheduled an appt for you to see Dr. Sidonie Dickensim [facility] [address] [place], [place] [phone]  Appt: Friday 01/11/2018 @ 10:30 am. Please arrive at 10:15 am

## 2018-01-09 NOTE — Progress Notes (Signed)
Sabrina Crawford - 62 y.o. female MRN [id number]  Date of birth: 02/03/1956   Chief complaint: L knee injury, MRI results  SUBJECTIVE:    History of present illness: Patient is a 62 year old female with a history of Parkinson's disease who presents today as follow-up after her left knee injury.  Her initial inciting injury was in October when her left knee buckled with a valgus stress.  The patient did hear an audible pop and had immediate swelling afterwards.  Her gait and left knee pain has been significantly worse after this initial injury.  She reports 4 falls since this injury.  She is getting intermittent popping of her left knee as well as a feeling of instability.  Her last fall was actually last week where she hit her left eye and still has ecchymoses from it.  She does have baseline gait instability from her Parkinson's which is managed on carbidopa and levodopa.  She denies any new injuries to the left knee.  She has been wearing a hinged knee brace which does help slightly.  She is also taking 600-800 mg of ibuprofen at night which helps with the pain.  She has not had a knee injection yet.  She denies any hip, groin, or ankle pain.  She denies any numbness or tingling of the left lower extremity.  Denies any low back pain.   Review of systems:  As stated above   Interval past medical history, surgical history, family history, and social history obtained and are unchanged. Pertinent medical hx include: anaphylaxis to contrast, Parkinson's disease, Takotsubo cardiomyopathy. Pertinent surgical hx: R knee arthroscopy with partial menisectomy. Nonsmoker. Lives with her daughter.  OBJECTIVE:  Physical exam: Vital signs are reviewed. BP 136/74   Ht 5\' 3"  (1.6 m)   Wt 181 lb (82.1 kg)   BMI 32.06 kg/m   Gen.: Alert, oriented, appears stated age, in no apparent distress HEENT: Ecchymosis of L orbit Respiratory: unlabored breathing Cardiac: Regular rate Integumentary: No rashes,  ecchymosis as above Gait: shuffling gait using 4w walker, antalgic Psych: pleasant Musculoskeletal: Inspection of the left knee demonstrates no acute abnormality.  She has medial joint line tenderness to palpation as well as some lateral joint line tenderness.  No patellofemoral tenderness.  No patellar crepitus.  Patient does have full range of motion in knee flexion and extension.  She has full hip range of motion in flexion and extension.  Strength testing is 4 out of 5 in knee flexion and extension.  4 out of 5 in hip abduction.  4 out of 5 in hip flexion.  Patient does have a delay in getting up from a seated position.  Negative Lachman test.  Negative anterior and posterior drawer.  Negative varus/valgus stress.  Positive McMurray test with medial joint line tenderness and a pop.  Patient is unable to perform a Thessaly test secondary to instability.   ASSESSMENT & PLAN: 1. Left knee medial meniscal tear  2. Mild osteoarthritis of the left knee 3. Gait instability due to Parkinson's   Plan: Today I personally reviewed the MRI images and discussed these with the patient.  They demonstrated a horizontal tear in the posterior horn of the medial meniscus as well as a focal cartilage defect on the medial femoral condyle.  Furthermore, there is mild osteoarthritis of the knee with a visualized prepatellar bursitis and small Baker's cyst.  These findings are consistent with what the patient reports as her symptoms.  Of note, I am concerned how her  gait has worsened since her left knee injury.  She has had up to 4 falls now none of which have caused significant injury however today she did have ecchymoses of the left orbital region.  I am referring her over to orthopedic surgery to discuss further intervention including a steroid injection versus surgery.  She may benefit from surgery given her significant instability and frequent falls.  She will continue to wear her hinged knee brace.  She also may take  ibuprofen 400 to 600 mg up to 3 times daily.  She is encouraged to still ambulate with a walker or cane.  She is in agreement with this plan.  Gustavus Messing, DO Sports Medicine Fellow [facility]

## 2018-01-10 ENCOUNTER — Encounter: Payer: Medicaid Other | Admitting: Occupational Therapy

## 2018-01-11 ENCOUNTER — Encounter: Payer: Self-pay | Admitting: Physical Therapy

## 2018-01-11 ENCOUNTER — Telehealth: Payer: Self-pay

## 2018-01-11 ENCOUNTER — Ambulatory Visit: Payer: Medicaid Other | Admitting: Physical Therapy

## 2018-01-11 NOTE — Therapy (Unsigned)
[facility] [address] [place], [place], [address] Phone: [phone]   Fax:  [phone]  Patient Details  Name: Sabrina Crawford MRN: [id number] Date of Birth: Nov 05, 1955 Referring Provider:  No ref. provider found  Encounter Date: 01/11/2018  PHYSICAL THERAPY DISCHARGE SUMMARY  Visits from Start of Care: 2 (eval and one visit)  Current functional level related to goals / functional outcomes: Pt did not return for final assessment. Called and stated she will be having knee surgery   Remaining deficits: See eval   Education / Equipment: Bed mobility, safe use of rollator Plan: Patient agrees to discharge.  Patient goals were not met. Patient is being discharged due to a change in medical status.  ?????       Rexanne Mano, PT 01/11/2018, 8:05 AM  [facility] [address] [place], [place], [address] Phone: [phone]   Fax:  [phone]

## 2018-01-11 NOTE — Telephone Encounter (Signed)
   [facility] Pre-operative Risk Assessment    Request for surgical clearance:  1. What type of surgery is being performed? Left Knee Scope, Menisectomy    2. When is this surgery scheduled? TBD   3. What type of clearance is required (medical clearance vs. Pharmacy clearance to hold med vs. Both)? Both  4. Are there any medications that need to be held prior to surgery and how long? Aspirin   5. Practice name and name of physician performing surgery? [facility] - Dr. Edmonia Lynch  6. What is your office phone number [phone]    7.   What is your office fax number [phone]  8.   Anesthesia type (None, local, MAC, general) ? unknown   Ena Dawley 01/11/2018, 4:51 PM  _________________________________________________________________   (provider comments below)

## 2018-01-15 ENCOUNTER — Ambulatory Visit: Payer: Medicaid Other | Admitting: Physical Therapy

## 2018-01-17 ENCOUNTER — Encounter: Payer: Medicaid Other | Admitting: Occupational Therapy

## 2018-01-17 NOTE — Telephone Encounter (Signed)
   Primary Cardiologist:Brian Jens Somrenshaw, MD  Chart reviewed as part of pre-operative protocol coverage. Because of Sabrina SandhoffSusanne Crawford's past medical history and time since last visit, he/she will require a follow-up visit in order to better assess preoperative cardiovascular risk.  Pre-op covering staff: - Pt has appt scheduled with L. Kilroy on 8/19. . - Please contact requesting surgeon's office via preferred method (i.e, phone, fax) to inform them of planned appointment and preop op clearance will be addressed at that time.  -Please add preop clearance to appt note on 8/19  Berton BonJanine Laniya Friedl, NP  01/17/2018, 3:36 PM

## 2018-01-17 NOTE — Telephone Encounter (Signed)
Spoke with requesting office . I have notified them that pt has a appointment to see Cardiology on 8/19 and clearance will be addressed at visit

## 2018-01-28 ENCOUNTER — Encounter: Payer: Self-pay | Admitting: Cardiology

## 2018-01-28 ENCOUNTER — Ambulatory Visit: Payer: Medicaid Other | Admitting: Cardiology

## 2018-01-28 VITALS — BP 112/76 | HR 66 | Ht 63.0 in | Wt 184.0 lb

## 2018-01-28 DIAGNOSIS — Z01810 Encounter for preprocedural cardiovascular examination: Secondary | ICD-10-CM

## 2018-01-28 DIAGNOSIS — Z91041 Radiographic dye allergy status: Secondary | ICD-10-CM | POA: Diagnosis not present

## 2018-01-28 DIAGNOSIS — G20 Parkinson's disease: Secondary | ICD-10-CM

## 2018-01-28 DIAGNOSIS — I5181 Takotsubo syndrome: Secondary | ICD-10-CM

## 2018-01-28 DIAGNOSIS — R296 Repeated falls: Secondary | ICD-10-CM

## 2018-01-28 NOTE — Patient Instructions (Signed)
MEDICATION INSTRUCTIONS   STOP  TAKING DIOVAN/VALSARTAN  CONTINUE ALL OTHER MEDICATIONS      YOU ARE CLEARED TO HAVE KNEE SURGERY WITH  [facility]   SCHEDULE  AT [address] AT THE END OF SEPT 2019 Your physician has requested that you have an echocardiogram. Echocardiography is a painless test that uses sound waves to create images of your heart. It provides your doctor with information about the size and shape of your heart and how well your heart's chambers and valves are working. This procedure takes approximately one hour. There are no restrictions for this procedure.    Your physician recommends that you KEEP schedule  follow-up appointment ON Mar 19, 2018 WITH DR CRENSHAW.   If you need a refill on your cardiac medications before your next appointment, please call your pharmacy.

## 2018-01-28 NOTE — Progress Notes (Signed)
01/28/2018 Sabrina Crawford   1955/10/25  [id number]  Primary Physician Leilani Able, MD Primary Cardiologist: Dr Jens Som  HPI:  Pleasant 62 y.o. femalewho moved her from [place] to get established with [facility] for treatment of Parkinson's. She was admitted with a NSTEMI 12/06/17 following an allergic reaction to gadolinium during an MRI to evalutae a pineal cyst. She developed SSCP and EKG changes. Her Troponin was elevated. Cardiac catheterization was done 12/06/17 and revealed a 40-50% LAD and LV dysfunction consistent with Takotsubo cardiomyopathy. Her EF was 25-30% with apical AK. She was hypotensive in the hospital and medications were limited. She was seen in the office 12/17/17. We tried adding low dose Valsartan for her cardiomyopathy but she was unable tolerate this secondary to low B/P and she has stopped it. She denies any chest pain or unusual dyspnea. She did have a syncopal spell 3 weeks ago while working in her garden, it sounds like that was secondary to a drop in her B/P. She is scheduled to have arthroscopic Lt knee surgery with Dr Eulah Pont and she is cleared from a cardiac standpoint for this.    Current Outpatient Medications  Medication Sig Dispense Refill  . albuterol (ACCUNEB) 0.63 MG/3ML nebulizer solution Take 3 mLs by nebulization daily as needed for shortness of breath.  3  . aspirin EC 81 MG EC tablet Take 1 tablet (81 mg total) by mouth daily.    Marland Kitchen atorvastatin (LIPITOR) 20 MG tablet Take 1 tablet (20 mg total) by mouth daily at 6 PM. 30 tablet 5  . carbidopa-levodopa (SINEMET IR) 25-100 MG tablet Take 1 tablet by mouth 5 (five) times daily.     . Carbidopa-Levodopa ER (SINEMET CR) 25-100 MG tablet controlled release Take 1 tablet by mouth 2 (two) times daily.  11  . carvedilol (COREG) 3.125 MG tablet Take 1 tablet (3.125 mg total) by mouth 2 (two) times daily with a meal. 60 tablet 5  . esomeprazole (NEXIUM) 40 MG capsule Take 40 mg by mouth every morning.  6    . furosemide (LASIX) 20 MG tablet Take 1 tablet (20 mg total) by mouth daily as needed. For fluid or weight gain greater than 2 pounds overnight. 30 tablet 5  . Melatonin 5 MG TABS Take 5 mg by mouth at bedtime.    . Multiple Vitamins-Minerals (HM MULTIVITAMIN ADULT GUMMY PO) Take 2 each by mouth daily at 6 (six) AM.    . omega-3 acid ethyl esters (LOVAZA) 1 g capsule Take 1 g by mouth daily at 2 PM.    . PRESCRIPTION MEDICATION Inhale 1 puff into the lungs daily. Sample from the doctor's office.  Does not know the name of the medication. steroid inhaler    . PROAIR HFA 108 (90 Base) MCG/ACT inhaler Inhale 1 puff into the lungs every 4 (four) hours as needed for shortness of breath or wheezing.  4  . TURMERIC PO Take 1 capsule by mouth daily at 6 PM.    . valsartan (DIOVAN) 40 MG tablet Take 0.5 tablets (20 mg total) by mouth at bedtime. 30 tablet 3  . venlafaxine XR (EFFEXOR-XR) 75 MG 24 hr capsule Take 75 mg by mouth daily at 2 PM.  11  . vitamin E (VITAMIN E) 1000 UNIT capsule Take 1,000 Units by mouth at bedtime.     No current facility-administered medications for this visit.     Allergies  Allergen Reactions  . Chamomile Shortness Of Breath    tea  .  Gadolinium Derivatives Anaphylaxis    12/06/17 After receiving Multihance: severe lip swelling, redness, laryngeal edema; to ED via EMS Shellfish  . Iodinated Diagnostic Agents Anaphylaxis    IV contrast  . Latex Anaphylaxis  . Methocarbamol Shortness Of Breath    Past Medical History:  Diagnosis Date  . Acute anaphylaxis    To contrast dye  . Blood transfusion without reported diagnosis   . Parkinson's disease (HCC)   . Pineal gland cyst   . Takotsubo cardiomyopathy    a. 11/2017: NSTEMI in the setting of allergic reaction with cath showing 40-50% LAD stenosis and EF reduced to 25-30%     Social History   Socioeconomic History  . Marital status: Divorced    Spouse name: Not on file  . Number of children: Not on file   . Years of education: Not on file  . Highest education level: Not on file  Occupational History  . Not on file  Social Needs  . Financial resource strain: Not on file  . Food insecurity:    Worry: Not on file    Inability: Not on file  . Transportation needs:    Medical: Not on file    Non-medical: Not on file  Tobacco Use  . Smoking status: Former Smoker    Last attempt to quit: 2000    Years since quitting: 19.6  . Smokeless tobacco: Never Used  . Tobacco comment: 1pk/week.   Substance and Sexual Activity  . Alcohol use: Yes    Alcohol/week: 2.0 standard drinks    Types: 2 Glasses of wine per week    Comment: occasionally   . Drug use: Never  . Sexual activity: Not on file  Lifestyle  . Physical activity:    Days per week: Not on file    Minutes per session: Not on file  . Stress: Not on file  Relationships  . Social connections:    Talks on phone: Not on file    Gets together: Not on file    Attends religious service: Not on file    Active member of club or organization: Not on file    Attends meetings of clubs or organizations: Not on file    Relationship status: Not on file  . Intimate partner violence:    Fear of current or ex partner: Not on file    Emotionally abused: Not on file    Physically abused: Not on file    Forced sexual activity: Not on file  Other Topics Concern  . Not on file  Social History Narrative  . Not on file     Family History  Problem Relation Age of Onset  . Heart failure Mother   . Heart failure Father      Review of Systems: General: negative for chills, fever, night sweats or weight changes.  Cardiovascular: negative for chest pain, dyspnea on exertion, edema, orthopnea, palpitations, paroxysmal nocturnal dyspnea or shortness of breath Dermatological: negative for rash Respiratory: negative for cough or wheezing Urologic: negative for hematuria Abdominal: negative for nausea, vomiting, diarrhea, bright red blood per  rectum, melena, or hematemesis Neurologic: negative for visual changes All other systems reviewed and are otherwise negative except as noted above.    Blood pressure 112/76, pulse 66, height 5\' 3"  (1.6 m), weight 184 lb (83.5 kg).  General appearance: alert, cooperative and no distress Neck: no JVD Lungs: clear to auscultation bilaterally Heart: regular rate and rhythm Extremities: no edema Skin: cool, pale, dry Neurologic: Grossly  normal, slight tremor in her speech. She uses a walker  EKG NSR, anterior lateral TWI  ASSESSMENT AND PLAN:   Pre op clearance- Chart reviewed and patient examined as part of pre-operative protocol coverage. Given past medical history and based on ACC/AHA guidelines, Sabrina Crawford would be at acceptable risk for the planned procedure without further cardiovascular testing. OK to hold ASA if needed pre op.  I will route this recommendation to the requesting party via Epic fax function and remove from pre-op pool. Please call with questions.  Takotsubo syndrome NSTEMI 12/06/17 after being treated for an allergic reaction 12/06/17 EF 25-30% with apical AK. I doubt she could tolerate Entresto secondary to low B/P.   Parkinsons [facility](HCC) [facility]-  Falls frequently History of falls, uses a cane   PLAN  Check echo at the end of September and follow up with Dr Jens Somrenshaw as scheduled in Oct. I'm concerned she may have residual CM based on her EKG. She is currently stable, taking lasix only as needed. Continue low dose Coreg.   Corine ShelterLuke Kam Rahimi PA-C 01/28/2018 9:29 AM

## 2018-01-31 NOTE — Progress Notes (Addendum)
Anesthesia Chart Review:  Case:  [id number] Date/Time:  02/05/18 1145   Procedures:      KNEE ARTHROSCOPY WITH MEDIAL MENISECTOMY POSSIBLE LATERAL MENISECTOMY (Left )     CHONDROPLASTY (Left )   Anesthesia type:  Choice   Pre-op diagnosis:  CHONDROMALACIA PATELLAE,LEFT KNEE,TEAR OF MEDIAL MENISCUS   Location:  [facility] ROOM 05 / [facility]   Surgeon:  Sheral Apley, MD      DISCUSSION: Patient is a 62 year old female scheduled for the above procedure.   History includes former smoker (quit '00), Parkinson's disease, pineal gland cyst, NSTEMI/Takotsubo Cardiomyopathy (12/06/17; following anaphylactic reaction to gandolinium). She recently relocated from [place]. - Hospitalized 12/06/17-12/08/17 with NSTEMI followinganallergic reaction to gadolinium during anMRIto evaluate a pineal cyst.She developed swollen lips and eyes, flushed face and wet cough. She received IV Benadryl and Epinephrine (1 or 2-1 mg doses) and was transported from [facility] ED where she received Solu-medrol, 0.3 mg epinephrine, Duoneb, and Pepcid. She had overall improvement in her respiratory symptoms, but reported some chest pain. Given chest pain and amount of epinephrine received, a troponin was ordered which was elevated at 1.38 (peak 3.07). Bedside echo revealed a wall motion abnormality. Cardiology admitted for further evaluation including formal echo and cardiac cath. She had a 40-50% LAD lesion, severe LV dysfunction (EF 25-30%), and apical ballooning consistent with Takotsubo Cardiomyopathy. Medical therapy initiated with plans for repeat echo in 6-8 weeks. (Of note, in out-patient follow-up medication titration has been limited due to hypotension. She had a syncopal spell about a month ago thought likely due to hypotension. Valsartan had to be discontinued.)  She had recent neurology and cardiology evaluations. Both services feel she can proceed with planned surgery.   I reviewed chart with anesthesiologist  Harlon Ditty, MD on 01/31/18. Based on available information, it is anticipated that she can proceed as planned.    VS: Pulse 65   Temp 36.6 C (Oral)   Resp 16   Ht 5\' 3"  (1.6 m)   Wt 83.5 kg   BMI 32.59 kg/m  BP no records from PST. BP on 01/28/18 was 112/76.   PROVIDERS: Leilani Able, MD is PCP  - Olga Millers, MD is cardiologist. Last visit 01/28/18 by Corine Shelter, PA-C for follow-up and preoperative evaluation. He wrote, "...based on ACC/AHA guidelines, Sabrina Crawford would be at acceptable risk for the planned procedure without further cardiovascular testing. OK to hold ASA if needed pre op." Repeat echo is planned 02/28/18. Valsartan discontinued due to hypotension. - Si Gaul, MD (attending) is neurologist [facility] Everywhere). Last visit with Ihor Austin, MD (resident) on 01/22/18. They are aware of surgery plans. They did recommend ST given complaint of difficultly swallowing.    LABS: Preoperative labs acceptable for OR.  (all labs ordered are listed, but only abnormal results are displayed)  Labs Reviewed  BASIC METABOLIC PANEL - Abnormal; Notable for the following components:      Result Value   Glucose, Bld 136 (*)    All other components within normal limits  CBC    IMAGES: CXR 12/17/17: IMPRESSION: No acute abnormalities.   EKG: 01/28/18: NSR, cannot rule out anterior infarct (age undetermined). ST/T wave abnormality, consider lateral ischemia.    CV: Echo 12/07/17: Study Conclusions - Left ventricle: The cavity size was normal. Systolic function was   severely reduced. The estimated ejection fraction was in the   range of 25% to 30%. Severe hypokinesis of the apical 2/3 of the  left ventricle with normal basal segment contractility. Doppler   parameters are consistent with abnormal left ventricular   relaxation (grade 1 diastolic dysfunction). No evidence of   thrombus. - Pulmonary arteries: Systolic pressure was mildly increased. PA   peak  pressure: 44 mm Hg (S). Impressions: - Echo abnormalities are highly compatible with stress   cardiomyopathy (takotsubo syndrome), although multivessel CAD   cannot be excluded solely on the basis of the echocardiographic   data.   Cardiac cath 12/06/17:  Right dominant coronary anatomy.  The right coronary is widely patent.  Normal left main  Widely patent LAD with 40 to 50% eccentric stenosis in the LAD beyond the origin of a large first diagonal.  The first diagonal appears to supply the left ventricular apex.  The LAD beyond the bifurcation with the diagonal may have an intramyocardial course.  Widely patent circumflex coronary artery.  Abnormal left ventricular contractile pattern with "apical ballooning" pattern of anterior anteroapical and inferoapical akinesis.  Elevated EDP rater than 30 mmHg.  Overall findings are compatible with stress cardiomyopathy/Takotsubo syndrome. RECOMMENDATIONS:  Supportive.  The Lasix 20 mg given because of severe systolic dysfunction, elevated EDP, and dyspnea.  May need pressor support if develops hypotension.   Past Medical History:  Diagnosis Date  . Acute anaphylaxis    To contrast dye  . Anxiety   . Arthritis   . Asthma   . Blood transfusion without reported diagnosis   . Dyspnea    at times  . Family history of adverse reaction to anesthesia   . GERD (gastroesophageal reflux disease)   . Parkinson's disease (HCC)   . Pineal gland cyst   . PONV (postoperative nausea and vomiting)   . Pre-diabetes    diet controlled  . Takotsubo cardiomyopathy    a. 11/2017: NSTEMI in the setting of allergic reaction with cath showing 40-50% LAD stenosis and EF reduced to 25-30%     Past Surgical History:  Procedure Laterality Date  . ABDOMINAL HYSTERECTOMY    . APPENDECTOMY    . bladder tack    . BREAST SURGERY     implant left side, reduction in right side  . BUNIONECTOMY     bil  . CHOLECYSTECTOMY    . knee arthroscopy Left knee      and bakers cyst Dr. Eulah Pont 02-05-18  . LEFT HEART CATH AND CORONARY ANGIOGRAPHY N/A 12/06/2017   Procedure: LEFT HEART CATH AND CORONARY ANGIOGRAPHY;  Surgeon: Lyn Records, MD;  Location: [facility] INVASIVE CV LAB;  Service: Cardiovascular;  Laterality: N/A;  . meniscus repair right knee    . thyroid nodule removed     calcium related    MEDICATIONS: . albuterol (ACCUNEB) 0.63 MG/3ML nebulizer solution  . aspirin EC 81 MG EC tablet  . atorvastatin (LIPITOR) 20 MG tablet  . carbidopa-levodopa (SINEMET IR) 25-100 MG tablet  . Carbidopa-Levodopa ER (SINEMET CR) 25-100 MG tablet controlled release  . carvedilol (COREG) 3.125 MG tablet  . esomeprazole (NEXIUM) 40 MG capsule  . furosemide (LASIX) 20 MG tablet  . Melatonin 5 MG TABS  . Multiple Vitamins-Minerals (HM MULTIVITAMIN ADULT GUMMY PO)  . omega-3 acid ethyl esters (LOVAZA) 1 g capsule  . PRESCRIPTION MEDICATION  . PROAIR HFA 108 (90 Base) MCG/ACT inhaler  . TURMERIC PO  . valsartan (DIOVAN) 40 MG tablet  . venlafaxine XR (EFFEXOR-XR) 75 MG 24 hr capsule  . vitamin E (VITAMIN E) 1000 UNIT capsule   No current facility-administered medications for this  encounter.   Valsartan discontinued at 01/28/18 office visit with [facility].   Velna Ochsllison Ivannah Zody, PA-C [facility]/Anesthesiology Phone [phone] 02/01/2018 4:19 PM

## 2018-01-31 NOTE — Patient Instructions (Addendum)
Fanny BienSusanne Mcquaig  01/31/2018   Your procedure is scheduled on: 02-05-18  [facility]  Entrance  Report to admitting at 1000 AM    Call this number if you have problems the morning of surgery [phone]   Remember: Do not eat food or drink liquids :After Midnight.     Take these medicines the morning of surgery with A SIP OF WATER: inhalers and bring , carvedilol, nexium, effexor                                You may not have any metal on your body including hair pins and              piercings  Do not wear jewelry, make-up, lotions, powders or perfumes, deodorant             Do not wear nail polish.  Do not shave  48 hours prior to surgery.     Do not bring valuables to the hospital. [facility] IS NOT             RESPONSIBLE   FOR VALUABLES.  Contacts, dentures or bridgework may not be worn into surgery.  Leave suitcase in the car. After surgery it may be brought to your room.     Patients discharged the day of surgery will not be allowed to drive home.  Name and phone number of your driver:  Special Instructions: N/A              Please read over the following fact sheets you were given: _____________________________________________________________________             [facility] - Preparing for Surgery Before surgery, you can play an important role.  Because skin is not sterile, your skin needs to be as free of germs as possible.  You can reduce the number of germs on your skin by washing with CHG (chlorahexidine gluconate) soap before surgery.  CHG is an antiseptic cleaner which kills germs and bonds with the skin to continue killing germs even after washing. Please DO NOT use if you have an allergy to CHG or antibacterial soaps.  If your skin becomes reddened/irritated stop using the CHG and inform your nurse when you arrive at Short Stay. Do not shave (including legs and underarms) for at least 48 hours prior to the first CHG  shower.  You may shave your face/neck. Please follow these instructions carefully:  1.  Shower with CHG Soap the night before surgery and the  morning of Surgery.  2.  If you choose to wash your hair, wash your hair first as usual with your  normal  shampoo.  3.  After you shampoo, rinse your hair and body thoroughly to remove the  shampoo.                           4.  Use CHG as you would any other liquid soap.  You can apply chg directly  to the skin and wash                       Gently with a scrungie or clean washcloth.  5.  Apply the CHG Soap to your body ONLY FROM THE NECK DOWN.   Do not  use on face/ open                           Wound or open sores. Avoid contact with eyes, ears mouth and genitals (private parts).                       Wash face,  Genitals (private parts) with your normal soap.             6.  Wash thoroughly, paying special attention to the area where your surgery  will be performed.  7.  Thoroughly rinse your body with warm water from the neck down.  8.  DO NOT shower/wash with your normal soap after using and rinsing off  the CHG Soap.                9.  Pat yourself dry with a clean towel.            10.  Wear clean pajamas.            11.  Place clean sheets on your bed the night of your first shower and do not  sleep with pets. Day of Surgery : Do not apply any lotions/deodorants the morning of surgery.  Please wear clean clothes to the hospital/surgery center.  FAILURE TO FOLLOW THESE INSTRUCTIONS MAY RESULT IN THE CANCELLATION OF YOUR SURGERY PATIENT SIGNATURE_________________________________  NURSE SIGNATURE__________________________________  ________________________________________________________________________

## 2018-01-31 NOTE — Progress Notes (Signed)
error 

## 2018-01-31 NOTE — Anesthesia Preprocedure Evaluation (Addendum)
Anesthesia Evaluation  Patient identified by MRN, date of birth, ID band Patient awake    Reviewed: Allergy & Precautions, NPO status , Patient's Chart, lab work & pertinent test results  History of Anesthesia Complications (+) PONV  Airway Mallampati: II  TM Distance: >3 FB Neck ROM: Full    Dental no notable dental hx.    Pulmonary neg pulmonary ROS, asthma , former smoker,    Pulmonary exam normal breath sounds clear to auscultation       Cardiovascular +CHF  Normal cardiovascular exam Rhythm:Regular Rate:Normal  Left ventricle: The cavity size was normal. Systolic function was   severely reduced. The estimated ejection fraction was in the   range of 25% to 30%. Severe hypokinesis of the apical 2/3 of the   left ventricle with normal basal segment contractility. Doppler   parameters are consistent with abnormal left ventricular   relaxation (grade 1 diastolic dysfunction). No evidence of   thrombus. - Pulmonary arteries: Systolic pressure was mildly increased. PA   peak pressure: 44 mm Hg (S).  Impressions:  - Echo abnormalities are highly compatible with stress   cardiomyopathy (takotsubo syndrome), although multivessel CAD   cannot be excluded solely on the basis of the echocardiographic   data.   Neuro/Psych Parkinsons dz negative psych ROS   GI/Hepatic negative GI ROS, Neg liver ROS,   Endo/Other  diabetes  Renal/GU negative Renal ROS  negative genitourinary   Musculoskeletal negative musculoskeletal ROS (+)   Abdominal   Peds negative pediatric ROS (+)  Hematology negative hematology ROS (+)   Anesthesia Other Findings   Reproductive/Obstetrics negative OB ROS                            Anesthesia Physical Anesthesia Plan  ASA: IV  Anesthesia Plan: Spinal   Post-op Pain Management:    Induction: Intravenous  PONV Risk Score and Plan: 4 or greater and  Ondansetron, Dexamethasone, Treatment may vary due to age or medical condition and Scopolamine patch - Pre-op  Airway Management Planned: Simple Face Mask  Additional Equipment:   Intra-op Plan:   Post-operative Plan:   Informed Consent: I have reviewed the patients History and Physical, chart, labs and discussed the procedure including the risks, benefits and alternatives for the proposed anesthesia with the patient or authorized representative who has indicated his/her understanding and acceptance.   Dental advisory given  Plan Discussed with: CRNA and Surgeon  Anesthesia Plan Comments:        Anesthesia Quick Evaluation

## 2018-02-01 ENCOUNTER — Encounter (HOSPITAL_COMMUNITY): Payer: Self-pay

## 2018-02-01 ENCOUNTER — Other Ambulatory Visit: Payer: Self-pay

## 2018-02-01 ENCOUNTER — Encounter (HOSPITAL_COMMUNITY)
Admission: RE | Admit: 2018-02-01 | Discharge: 2018-02-01 | Disposition: A | Discharge status: Home / Self Care | Payer: Medicaid Other | Source: Ambulatory Visit | Attending: Orthopedic Surgery | Admitting: Orthopedic Surgery

## 2018-02-01 DIAGNOSIS — G20 Parkinson's disease: Secondary | ICD-10-CM | POA: Diagnosis not present

## 2018-02-01 DIAGNOSIS — M2242 Chondromalacia patellae, left knee: Secondary | ICD-10-CM | POA: Insufficient documentation

## 2018-02-01 DIAGNOSIS — Z79899 Other long term (current) drug therapy: Secondary | ICD-10-CM | POA: Diagnosis not present

## 2018-02-01 DIAGNOSIS — X58XXXA Exposure to other specified factors, initial encounter: Secondary | ICD-10-CM | POA: Insufficient documentation

## 2018-02-01 DIAGNOSIS — Z01812 Encounter for preprocedural laboratory examination: Secondary | ICD-10-CM | POA: Insufficient documentation

## 2018-02-01 DIAGNOSIS — Z7982 Long term (current) use of aspirin: Secondary | ICD-10-CM | POA: Insufficient documentation

## 2018-02-01 DIAGNOSIS — Z87891 Personal history of nicotine dependence: Secondary | ICD-10-CM | POA: Diagnosis not present

## 2018-02-01 DIAGNOSIS — R7303 Prediabetes: Secondary | ICD-10-CM | POA: Insufficient documentation

## 2018-02-01 DIAGNOSIS — F419 Anxiety disorder, unspecified: Secondary | ICD-10-CM | POA: Insufficient documentation

## 2018-02-01 DIAGNOSIS — K219 Gastro-esophageal reflux disease without esophagitis: Secondary | ICD-10-CM | POA: Insufficient documentation

## 2018-02-01 DIAGNOSIS — S83242A Other tear of medial meniscus, current injury, left knee, initial encounter: Secondary | ICD-10-CM | POA: Diagnosis not present

## 2018-02-01 DIAGNOSIS — I252 Old myocardial infarction: Secondary | ICD-10-CM | POA: Diagnosis not present

## 2018-02-01 DIAGNOSIS — J45909 Unspecified asthma, uncomplicated: Secondary | ICD-10-CM | POA: Diagnosis not present

## 2018-02-01 HISTORY — DX: Other specified postprocedural states: R11.2

## 2018-02-01 HISTORY — DX: Anxiety disorder, unspecified: F41.9

## 2018-02-01 HISTORY — DX: Unspecified asthma, uncomplicated: J45.909

## 2018-02-01 HISTORY — DX: Unspecified osteoarthritis, unspecified site: M19.90

## 2018-02-01 HISTORY — DX: Family history of other specified conditions: Z84.89

## 2018-02-01 HISTORY — DX: Other specified postprocedural states: Z98.890

## 2018-02-01 HISTORY — DX: Gastro-esophageal reflux disease without esophagitis: K21.9

## 2018-02-01 HISTORY — DX: Prediabetes: R73.03

## 2018-02-01 HISTORY — DX: Dyspnea, unspecified: R06.00

## 2018-02-01 LAB — CBC
HEMATOCRIT: 38.5 % (ref 36.0–46.0)
HEMOGLOBIN: 12.6 g/dL (ref 12.0–15.0)
MCH: 29.4 pg (ref 26.0–34.0)
MCHC: 32.7 g/dL (ref 30.0–36.0)
MCV: 90 fL (ref 78.0–100.0)
Platelets: 216 10*3/uL (ref 150–400)
RBC: 4.28 MIL/uL (ref 3.87–5.11)
RDW: 13.2 % (ref 11.5–15.5)
WBC: 7.1 10*3/uL (ref 4.0–10.5)

## 2018-02-01 LAB — BASIC METABOLIC PANEL
Anion gap: 6 (ref 5–15)
BUN: 17 mg/dL (ref 8–23)
CO2: 30 mmol/L (ref 22–32)
Calcium: 9.1 mg/dL (ref 8.9–10.3)
Chloride: 104 mmol/L (ref 98–111)
Creatinine, Ser: 0.58 mg/dL (ref 0.44–1.00)
GFR calc non Af Amer: >60 mL/min (ref >60)
Glucose, Bld: 136 mg/dL — ABNORMAL HIGH (ref 70–99)
POTASSIUM: 3.8 mmol/L (ref 3.5–5.1)
Sodium: 140 mmol/L (ref 135–145)

## 2018-02-01 NOTE — Progress Notes (Signed)
Shonna ChockAllison Zelenak PA-C  Chart review note showed  to Dr. Tiffany Kocherarigan anesthesia.

## 2018-02-01 NOTE — Progress Notes (Signed)
Cardiac clearance epic8-19-19  Echo 12-07-17 epic   hgba1c 12-06-17 epic  Cath 12-06-17 epic  cxr 12-17-17 epic

## 2018-02-04 ENCOUNTER — Ambulatory Visit: Payer: Self-pay | Admitting: Physician Assistant

## 2018-02-04 NOTE — H&P (Signed)
Sabrina BienSusanne Crawford is an 62 y.o. female.   Chief Complaint: left knee medial meniscus tear HPI: She fell in October twisting her knee.  She has had popping in her knee since then.  She had an MRI, which demonstrates some mild arthrosis, but a horizontal tear in the posterior horn of the medial meniscus, possible small chondral injury and central fraying of the lateral meniscus.    Past Medical History:  Diagnosis Date  . Acute anaphylaxis    To contrast dye  . Anxiety   . Arthritis   . Asthma   . Blood transfusion without reported diagnosis   . Dyspnea    at times  . Family history of adverse reaction to anesthesia   . GERD (gastroesophageal reflux disease)   . Parkinson's disease (HCC)   . Pineal gland cyst   . PONV (postoperative nausea and vomiting)   . Pre-diabetes    diet controlled  . Takotsubo cardiomyopathy    a. 11/2017: NSTEMI in the setting of allergic reaction with cath showing 40-50% LAD stenosis and EF reduced to 25-30%     Past Surgical History:  Procedure Laterality Date  . ABDOMINAL HYSTERECTOMY    . APPENDECTOMY    . bladder tack    . BREAST SURGERY     implant left side, reduction in right side  . BUNIONECTOMY     bil  . CHOLECYSTECTOMY    . knee arthroscopy Left knee     and bakers cyst Dr. Eulah PontMurphy 02-05-18  . LEFT HEART CATH AND CORONARY ANGIOGRAPHY N/A 12/06/2017   Procedure: LEFT HEART CATH AND CORONARY ANGIOGRAPHY;  Surgeon: Lyn RecordsSmith, Henry W, MD;  Location: [facility];  Service: Cardiovascular;  Laterality: N/A;  . meniscus repair right knee    . thyroid nodule removed     calcium related    Family History  Problem Relation Age of Onset  . Heart failure Mother   . Heart failure Father    Social History:  reports that she quit smoking about 19 years ago. She has never used smokeless tobacco. She reports that she drinks about 2.0 standard drinks of alcohol per week. She reports that she does not use drugs.  Allergies:  Allergies  Allergen  Reactions  . Chamomile Shortness Of Breath    tea  . Gadolinium Derivatives Anaphylaxis    12/06/17 After receiving Multihance: severe lip swelling, redness, laryngeal edema; to ED via EMS Shellfish  . Iodinated Diagnostic Agents Anaphylaxis    IV contrast  . Latex Anaphylaxis  . Methocarbamol Shortness Of Breath     (Not in a hospital admission)  No results found for this or any previous visit (from the past 48 hour(s)). No results found.  Review of Systems  Musculoskeletal: Positive for falls and joint pain.  All other systems reviewed and are negative.   There were no vitals taken for this visit. Physical Exam  Constitutional: She is oriented to person, place, and time. She appears well-developed and well-nourished. No distress.  HENT:  Head: Normocephalic and atraumatic.  Eyes: Pupils are equal, round, and reactive to light. Conjunctivae are normal.  Neck: Normal range of motion. Neck supple.  Cardiovascular: Normal rate and intact distal pulses.  Respiratory: Effort normal. No respiratory distress.  GI: Soft. She exhibits no distension.  Musculoskeletal:       Left knee: She exhibits swelling. She exhibits no effusion. Tenderness found. Medial joint line tenderness noted.  Neurological: She is alert and oriented to person, place,  and time.  Skin: Skin is warm and dry. No rash noted. No erythema.  Psychiatric: She has a normal mood and affect. Her behavior is normal.     Assessment/Plan Meniscal tear, possible chondral injury of the left knee.  I discussed the risks and benefits of arthroscopic chondroplasty, partial medial versus partial medial and lateral partial meniscectomies.  She would like to go forward with that.  She will follow up after.    Margart Sickles, PA-C 02/04/2018, 1:04 PM

## 2018-02-04 NOTE — H&P (View-Only) (Signed)
Sabrina Crawford is an 62 y.o. female.   Chief Complaint: left knee medial meniscus tear HPI: She fell in October twisting her knee.  She has had popping in her knee since then.  She had an MRI, which demonstrates some mild arthrosis, but a horizontal tear in the posterior horn of the medial meniscus, possible small chondral injury and central fraying of the lateral meniscus.    Past Medical History:  Diagnosis Date  . Acute anaphylaxis    To contrast dye  . Anxiety   . Arthritis   . Asthma   . Blood transfusion without reported diagnosis   . Dyspnea    at times  . Family history of adverse reaction to anesthesia   . GERD (gastroesophageal reflux disease)   . Parkinson's disease (HCC)   . Pineal gland cyst   . PONV (postoperative nausea and vomiting)   . Pre-diabetes    diet controlled  . Takotsubo cardiomyopathy    a. 11/2017: NSTEMI in the setting of allergic reaction with cath showing 40-50% LAD stenosis and EF reduced to 25-30%     Past Surgical History:  Procedure Laterality Date  . ABDOMINAL HYSTERECTOMY    . APPENDECTOMY    . bladder tack    . BREAST SURGERY     implant left side, reduction in right side  . BUNIONECTOMY     bil  . CHOLECYSTECTOMY    . knee arthroscopy Left knee     and bakers cyst Dr. Murphy 02-05-18  . LEFT HEART CATH AND CORONARY ANGIOGRAPHY N/A 12/06/2017   Procedure: LEFT HEART CATH AND CORONARY ANGIOGRAPHY;  Surgeon: Smith, Henry W, MD;  Location: [facility];  Service: Cardiovascular;  Laterality: N/A;  . meniscus repair right knee    . thyroid nodule removed     calcium related    Family History  Problem Relation Age of Onset  . Heart failure Mother   . Heart failure Father    Social History:  reports that she quit smoking about 19 years ago. She has never used smokeless tobacco. She reports that she drinks about 2.0 standard drinks of alcohol per week. She reports that she does not use drugs.  Allergies:  Allergies  Allergen  Reactions  . Chamomile Shortness Of Breath    tea  . Gadolinium Derivatives Anaphylaxis    12/06/17 After receiving Multihance: severe lip swelling, redness, laryngeal edema; to ED via EMS Shellfish  . Iodinated Diagnostic Agents Anaphylaxis    IV contrast  . Latex Anaphylaxis  . Methocarbamol Shortness Of Breath     (Not in a hospital admission)  No results found for this or any previous visit (from the past 48 hour(s)). No results found.  Review of Systems  Musculoskeletal: Positive for falls and joint pain.  All other systems reviewed and are negative.   There were no vitals taken for this visit. Physical Exam  Constitutional: She is oriented to person, place, and time. She appears well-developed and well-nourished. No distress.  HENT:  Head: Normocephalic and atraumatic.  Eyes: Pupils are equal, round, and reactive to light. Conjunctivae are normal.  Neck: Normal range of motion. Neck supple.  Cardiovascular: Normal rate and intact distal pulses.  Respiratory: Effort normal. No respiratory distress.  GI: Soft. She exhibits no distension.  Musculoskeletal:       Left knee: She exhibits swelling. She exhibits no effusion. Tenderness found. Medial joint line tenderness noted.  Neurological: She is alert and oriented to person, place,   and time.  Skin: Skin is warm and dry. No rash noted. No erythema.  Psychiatric: She has a normal mood and affect. Her behavior is normal.     Assessment/Plan Meniscal tear, possible chondral injury of the left knee.  I discussed the risks and benefits of arthroscopic chondroplasty, partial medial versus partial medial and lateral partial meniscectomies.  She would like to go forward with that.  She will follow up after.    Otoniel Myhand, PA-C 02/04/2018, 1:04 PM   

## 2018-02-05 ENCOUNTER — Encounter (HOSPITAL_COMMUNITY): Payer: Self-pay | Admitting: *Deleted

## 2018-02-05 ENCOUNTER — Observation Stay (HOSPITAL_COMMUNITY)
Admission: RE | Admit: 2018-02-05 | Discharge: 2018-02-06 | Disposition: A | Discharge status: Home / Self Care | Payer: Medicaid Other | Source: Ambulatory Visit | Attending: Orthopedic Surgery | Admitting: Orthopedic Surgery

## 2018-02-05 ENCOUNTER — Ambulatory Visit (HOSPITAL_COMMUNITY): Payer: Medicaid Other | Admitting: Vascular Surgery

## 2018-02-05 ENCOUNTER — Encounter (HOSPITAL_COMMUNITY)
Admission: RE | Disposition: A | Discharge status: Home / Self Care | Payer: Self-pay | Source: Ambulatory Visit | Attending: Orthopedic Surgery

## 2018-02-05 ENCOUNTER — Other Ambulatory Visit: Payer: Self-pay

## 2018-02-05 DIAGNOSIS — R7303 Prediabetes: Secondary | ICD-10-CM | POA: Insufficient documentation

## 2018-02-05 DIAGNOSIS — I5181 Takotsubo syndrome: Secondary | ICD-10-CM | POA: Diagnosis not present

## 2018-02-05 DIAGNOSIS — X501XXA Overexertion from prolonged static or awkward postures, initial encounter: Secondary | ICD-10-CM | POA: Insufficient documentation

## 2018-02-05 DIAGNOSIS — Z79899 Other long term (current) drug therapy: Secondary | ICD-10-CM | POA: Insufficient documentation

## 2018-02-05 DIAGNOSIS — G20 Parkinson's disease: Secondary | ICD-10-CM | POA: Insufficient documentation

## 2018-02-05 DIAGNOSIS — F419 Anxiety disorder, unspecified: Secondary | ICD-10-CM | POA: Diagnosis not present

## 2018-02-05 DIAGNOSIS — M2242 Chondromalacia patellae, left knee: Secondary | ICD-10-CM | POA: Diagnosis not present

## 2018-02-05 DIAGNOSIS — I509 Heart failure, unspecified: Secondary | ICD-10-CM | POA: Insufficient documentation

## 2018-02-05 DIAGNOSIS — S83282A Other tear of lateral meniscus, current injury, left knee, initial encounter: Secondary | ICD-10-CM | POA: Insufficient documentation

## 2018-02-05 DIAGNOSIS — S83249A Other tear of medial meniscus, current injury, unspecified knee, initial encounter: Secondary | ICD-10-CM | POA: Diagnosis present

## 2018-02-05 DIAGNOSIS — S83242A Other tear of medial meniscus, current injury, left knee, initial encounter: Secondary | ICD-10-CM

## 2018-02-05 DIAGNOSIS — Y929 Unspecified place or not applicable: Secondary | ICD-10-CM | POA: Insufficient documentation

## 2018-02-05 DIAGNOSIS — S83232A Complex tear of medial meniscus, current injury, left knee, initial encounter: Secondary | ICD-10-CM | POA: Diagnosis not present

## 2018-02-05 DIAGNOSIS — Z7982 Long term (current) use of aspirin: Secondary | ICD-10-CM | POA: Diagnosis not present

## 2018-02-05 DIAGNOSIS — Y9301 Activity, walking, marching and hiking: Secondary | ICD-10-CM | POA: Diagnosis not present

## 2018-02-05 DIAGNOSIS — J45909 Unspecified asthma, uncomplicated: Secondary | ICD-10-CM | POA: Insufficient documentation

## 2018-02-05 HISTORY — PX: KNEE ARTHROSCOPY WITH MEDIAL MENISECTOMY: SHX5651

## 2018-02-05 HISTORY — PX: CHONDROPLASTY: SHX5177

## 2018-02-05 SURGERY — ARTHROSCOPY, KNEE, WITH MEDIAL MENISCECTOMY
Anesthesia: General | Site: Knee | Laterality: Left

## 2018-02-05 MED ORDER — PROPOFOL 10 MG/ML IV BOLUS
INTRAVENOUS | Status: AC
  Filled 2018-02-05: qty 40

## 2018-02-05 MED ORDER — FENTANYL CITRATE (PF) 100 MCG/2ML IJ SOLN: 25.0000 ug | INTRAMUSCULAR | Status: DC | PRN

## 2018-02-05 MED ORDER — ONDANSETRON HCL 4 MG/2ML IJ SOLN
INTRAMUSCULAR | Status: AC
  Filled 2018-02-05: qty 2

## 2018-02-05 MED ORDER — DEXAMETHASONE SODIUM PHOSPHATE 10 MG/ML IJ SOLN
INTRAMUSCULAR | Status: AC
  Filled 2018-02-05: qty 1

## 2018-02-05 MED ORDER — SCOPOLAMINE 1 MG/3DAYS TD PT72
MEDICATED_PATCH | TRANSDERMAL | Status: AC
  Filled 2018-02-05: qty 1

## 2018-02-05 MED ORDER — FENTANYL CITRATE (PF) 100 MCG/2ML IJ SOLN
INTRAMUSCULAR | Status: DC | PRN
  Administered 2018-02-05 (×4): 25 ug via INTRAVENOUS

## 2018-02-05 MED ORDER — ONDANSETRON HCL 4 MG/2ML IJ SOLN
INTRAMUSCULAR | Status: DC | PRN
  Administered 2018-02-05: 4 mg via INTRAVENOUS

## 2018-02-05 MED ORDER — PROPOFOL 10 MG/ML IV BOLUS
INTRAVENOUS | Status: AC
  Filled 2018-02-05: qty 20

## 2018-02-05 MED ORDER — CHLORHEXIDINE GLUCONATE 4 % EX LIQD: 60.0000 mL | Freq: Once | CUTANEOUS | Status: DC

## 2018-02-05 MED ORDER — DOCUSATE SODIUM 100 MG PO CAPS
100.0000 mg | ORAL_CAPSULE | Freq: Two times a day (BID) | ORAL | Status: DC
  Administered 2018-02-05 – 2018-02-06 (×2): 100 mg via ORAL
  Filled 2018-02-05 (×2): qty 1

## 2018-02-05 MED ORDER — CARBIDOPA-LEVODOPA ER 25-100 MG PO TBCR
1.0000 | EXTENDED_RELEASE_TABLET | Freq: Two times a day (BID) | ORAL | Status: DC
  Administered 2018-02-05 – 2018-02-06 (×2): 1 via ORAL
  Filled 2018-02-05 (×2): qty 1

## 2018-02-05 MED ORDER — MIDAZOLAM HCL 2 MG/2ML IJ SOLN
INTRAMUSCULAR | Status: AC
  Filled 2018-02-05: qty 2

## 2018-02-05 MED ORDER — CARVEDILOL 3.125 MG PO TABS
3.1250 mg | ORAL_TABLET | Freq: Two times a day (BID) | ORAL | Status: DC
  Administered 2018-02-06: 3.125 mg via ORAL
  Filled 2018-02-05: qty 1

## 2018-02-05 MED ORDER — METHYLPREDNISOLONE ACETATE 80 MG/ML IJ SUSP
INTRAMUSCULAR | Status: DC | PRN
  Administered 2018-02-05: 80 mg

## 2018-02-05 MED ORDER — CARBIDOPA-LEVODOPA 25-100 MG PO TABS
1.0000 | ORAL_TABLET | Freq: Every day | ORAL | Status: DC
  Administered 2018-02-05 – 2018-02-06 (×4): 1 via ORAL
  Filled 2018-02-05 (×4): qty 1

## 2018-02-05 MED ORDER — PROPOFOL 500 MG/50ML IV EMUL
INTRAVENOUS | Status: DC | PRN
  Administered 2018-02-05: 50 ug/kg/min via INTRAVENOUS

## 2018-02-05 MED ORDER — ONDANSETRON HCL 4 MG PO TABS: 4.0000 mg | ORAL_TABLET | Freq: Four times a day (QID) | ORAL | Status: DC | PRN

## 2018-02-05 MED ORDER — BUPIVACAINE HCL (PF) 0.25 % IJ SOLN
INTRAMUSCULAR | Status: AC
  Filled 2018-02-05: qty 30

## 2018-02-05 MED ORDER — SODIUM CHLORIDE 0.9 % IV SOLN
INTRAVENOUS | Status: DC | PRN
  Administered 2018-02-05: 1000 mL via INTRAVENOUS

## 2018-02-05 MED ORDER — MIDAZOLAM HCL 2 MG/2ML IJ SOLN
INTRAMUSCULAR | Status: DC | PRN
  Administered 2018-02-05: 2 mg via INTRAVENOUS

## 2018-02-05 MED ORDER — PROPOFOL 10 MG/ML IV BOLUS
INTRAVENOUS | Status: DC | PRN
  Administered 2018-02-05: 20 mg via INTRAVENOUS

## 2018-02-05 MED ORDER — GABAPENTIN 300 MG PO CAPS
300.0000 mg | ORAL_CAPSULE | Freq: Three times a day (TID) | ORAL | Status: DC
  Administered 2018-02-05 – 2018-02-06 (×3): 300 mg via ORAL
  Filled 2018-02-05 (×3): qty 1

## 2018-02-05 MED ORDER — OMEGA-3-ACID ETHYL ESTERS 1 G PO CAPS: 1.0000 g | ORAL_CAPSULE | Freq: Every day | ORAL | Status: DC

## 2018-02-05 MED ORDER — SODIUM CHLORIDE 0.9 % IV SOLN: INTRAVENOUS | Status: DC

## 2018-02-05 MED ORDER — SCOPOLAMINE 1 MG/3DAYS TD PT72
MEDICATED_PATCH | TRANSDERMAL | Status: DC | PRN
  Administered 2018-02-05: 1 via TRANSDERMAL

## 2018-02-05 MED ORDER — CEFAZOLIN SODIUM-DEXTROSE 2-4 GM/100ML-% IV SOLN
2.0000 g | INTRAVENOUS | Status: AC
  Administered 2018-02-05: 2 g via INTRAVENOUS

## 2018-02-05 MED ORDER — FUROSEMIDE 20 MG PO TABS: 20.0000 mg | ORAL_TABLET | Freq: Every day | ORAL | Status: DC | PRN

## 2018-02-05 MED ORDER — ALBUTEROL SULFATE (2.5 MG/3ML) 0.083% IN NEBU: 2.5000 mg | INHALATION_SOLUTION | RESPIRATORY_TRACT | Status: DC | PRN

## 2018-02-05 MED ORDER — HYDROCODONE-ACETAMINOPHEN 5-325 MG PO TABS
1.0000 | ORAL_TABLET | ORAL | Status: DC | PRN
  Administered 2018-02-05: 2 via ORAL
  Filled 2018-02-05 (×2): qty 2

## 2018-02-05 MED ORDER — MELATONIN 5 MG PO TABS
5.0000 mg | ORAL_TABLET | Freq: Every day | ORAL | Status: DC
  Administered 2018-02-05: 5 mg via ORAL
  Filled 2018-02-05: qty 1

## 2018-02-05 MED ORDER — CEFAZOLIN SODIUM-DEXTROSE 2-4 GM/100ML-% IV SOLN
INTRAVENOUS | Status: AC
  Filled 2018-02-05: qty 100

## 2018-02-05 MED ORDER — PANTOPRAZOLE SODIUM 40 MG PO TBEC: 80.0000 mg | DELAYED_RELEASE_TABLET | Freq: Every day | ORAL | Status: DC

## 2018-02-05 MED ORDER — METOCLOPRAMIDE HCL 5 MG/ML IJ SOLN: 5.0000 mg | Freq: Three times a day (TID) | INTRAMUSCULAR | Status: DC | PRN

## 2018-02-05 MED ORDER — VENLAFAXINE HCL ER 75 MG PO CP24
75.0000 mg | ORAL_CAPSULE | Freq: Every day | ORAL | Status: DC
  Administered 2018-02-06: 75 mg via ORAL
  Filled 2018-02-05: qty 1

## 2018-02-05 MED ORDER — CEFAZOLIN SODIUM-DEXTROSE 2-4 GM/100ML-% IV SOLN
2.0000 g | Freq: Four times a day (QID) | INTRAVENOUS | Status: AC
  Administered 2018-02-05 – 2018-02-06 (×3): 2 g via INTRAVENOUS
  Filled 2018-02-05 (×3): qty 100

## 2018-02-05 MED ORDER — KETOROLAC TROMETHAMINE 30 MG/ML IJ SOLN: 30.0000 mg | Freq: Once | INTRAMUSCULAR | Status: DC | PRN

## 2018-02-05 MED ORDER — LACTATED RINGERS IV SOLN
INTRAVENOUS | Status: DC
  Administered 2018-02-05: 09:00:00 via INTRAVENOUS

## 2018-02-05 MED ORDER — ONDANSETRON HCL 4 MG/2ML IJ SOLN: 4.0000 mg | Freq: Four times a day (QID) | INTRAMUSCULAR | Status: DC | PRN

## 2018-02-05 MED ORDER — BUPIVACAINE-EPINEPHRINE (PF) 0.25% -1:200000 IJ SOLN
INTRAMUSCULAR | Status: AC
  Filled 2018-02-05: qty 30

## 2018-02-05 MED ORDER — METOCLOPRAMIDE HCL 5 MG PO TABS: 5.0000 mg | ORAL_TABLET | Freq: Three times a day (TID) | ORAL | Status: DC | PRN

## 2018-02-05 MED ORDER — PROMETHAZINE HCL 25 MG/ML IJ SOLN: 6.2500 mg | INTRAMUSCULAR | Status: DC | PRN

## 2018-02-05 MED ORDER — SODIUM CHLORIDE 0.9 % IV SOLN
INTRAVENOUS | Status: DC
  Administered 2018-02-05: 19:00:00 via INTRAVENOUS

## 2018-02-05 MED ORDER — VITAMIN E 45 MG (100 UNIT) PO CAPS
1000.0000 [IU] | ORAL_CAPSULE | ORAL | Status: DC
  Administered 2018-02-06: 1000 [IU] via ORAL
  Filled 2018-02-05: qty 2

## 2018-02-05 MED ORDER — FENTANYL CITRATE (PF) 100 MCG/2ML IJ SOLN
INTRAMUSCULAR | Status: AC
  Filled 2018-02-05: qty 2

## 2018-02-05 MED ORDER — METHYLPREDNISOLONE ACETATE 40 MG/ML IJ SUSP
INTRAMUSCULAR | Status: AC
  Filled 2018-02-05: qty 2

## 2018-02-05 MED ORDER — ATORVASTATIN CALCIUM 20 MG PO TABS
20.0000 mg | ORAL_TABLET | Freq: Every day | ORAL | Status: DC
  Administered 2018-02-05: 20 mg via ORAL
  Filled 2018-02-05: qty 1

## 2018-02-05 MED ORDER — BUPIVACAINE-EPINEPHRINE (PF) 0.5% -1:200000 IJ SOLN
INTRAMUSCULAR | Status: AC
  Filled 2018-02-05: qty 30

## 2018-02-05 MED ORDER — ASPIRIN EC 81 MG PO TBEC
81.0000 mg | DELAYED_RELEASE_TABLET | Freq: Every day | ORAL | Status: DC
  Administered 2018-02-06: 81 mg via ORAL
  Filled 2018-02-05: qty 1

## 2018-02-05 MED ORDER — IRBESARTAN 75 MG PO TABS: 75.0000 mg | ORAL_TABLET | Freq: Every day | ORAL | Status: DC

## 2018-02-05 MED ORDER — BUPIVACAINE-EPINEPHRINE 0.25% -1:200000 IJ SOLN
INTRAMUSCULAR | Status: DC | PRN
  Administered 2018-02-05: 30 mL

## 2018-02-05 MED ORDER — DEXAMETHASONE SODIUM PHOSPHATE 4 MG/ML IJ SOLN
INTRAMUSCULAR | Status: DC | PRN
  Administered 2018-02-05: 5 mg via INTRAVENOUS

## 2018-02-05 MED ORDER — SODIUM CHLORIDE 0.9 % IR SOLN
Status: DC | PRN
  Administered 2018-02-05: 6000 mL

## 2018-02-05 MED ORDER — PHENYLEPHRINE HCL-NACL 10-0.9 MG/250ML-% IV SOLN
INTRAVENOUS | Status: AC
  Filled 2018-02-05: qty 250

## 2018-02-05 MED ORDER — SODIUM CHLORIDE 0.9 % IV SOLN
INTRAVENOUS | Status: DC | PRN
  Administered 2018-02-05: 10 ug/min via INTRAVENOUS

## 2018-02-05 SURGICAL SUPPLY — 37 items
BANDAGE ACE 6X5 VEL STRL LF (GAUZE/BANDAGES/DRESSINGS) ×3 IMPLANT
BANDAGE ESMARK 6X9 LF (GAUZE/BANDAGES/DRESSINGS) IMPLANT
BLADE CLIPPER SURG (BLADE) IMPLANT
BLADE CUDA GRT WHITE 3.5 (BLADE) ×3 IMPLANT
BLADE CUTTER GATOR 3.5 (BLADE) ×3 IMPLANT
BNDG ESMARK 6X9 LF (GAUZE/BANDAGES/DRESSINGS)
COVER SURGICAL LIGHT HANDLE (MISCELLANEOUS) ×3 IMPLANT
CUFF TOURN SGL QUICK 18 (TOURNIQUET CUFF) IMPLANT
CUFF TOURN SGL QUICK 34 (TOURNIQUET CUFF) ×2
CUFF TRNQT CYL 34X4X40X1 (TOURNIQUET CUFF) ×1 IMPLANT
DRAPE LG THREE QUARTER DISP (DRAPES) ×3 IMPLANT
DRSG EMULSION OIL 3X3 NADH (GAUZE/BANDAGES/DRESSINGS) ×3 IMPLANT
DRSG PAD ABDOMINAL 8X10 ST (GAUZE/BANDAGES/DRESSINGS) IMPLANT
DURAPREP 26ML APPLICATOR (WOUND CARE) ×3 IMPLANT
FACESHIELD WRAPAROUND (MASK) ×3 IMPLANT
GAUZE SPONGE 4X4 12PLY STRL (GAUZE/BANDAGES/DRESSINGS) IMPLANT
GAUZE XEROFORM 1X8 LF (GAUZE/BANDAGES/DRESSINGS) ×3 IMPLANT
GLOVE BIO SURGEON STRL SZ7.5 (GLOVE) ×6 IMPLANT
GLOVE BIOGEL PI IND STRL 8 (GLOVE) ×2 IMPLANT
GLOVE BIOGEL PI INDICATOR 8 (GLOVE) ×4
GOWN STRL REUS W/ TWL LRG LVL3 (GOWN DISPOSABLE) ×2 IMPLANT
GOWN STRL REUS W/TWL LRG LVL3 (GOWN DISPOSABLE) ×4
IRRIGATOR SUCT 8 DISP DVNC XI (IRRIGATION / IRRIGATOR) ×1 IMPLANT
IRRIGATOR SUCTION 8MM XI DISP (IRRIGATION / IRRIGATOR) ×2
KIT TURNOVER KIT B (KITS) ×3 IMPLANT
MANIFOLD NEPTUNE II (INSTRUMENTS) IMPLANT
NS IRRIG 1000ML POUR BTL (IV SOLUTION) IMPLANT
PACK ARTHROSCOPY WL (CUSTOM PROCEDURE TRAY) ×3 IMPLANT
PAD ABD 8X10 STRL (GAUZE/BANDAGES/DRESSINGS) ×3 IMPLANT
PAD ARMBOARD 7.5X6 YLW CONV (MISCELLANEOUS) ×6 IMPLANT
PADDING CAST COTTON 6X4 STRL (CAST SUPPLIES) IMPLANT
SET ARTHROSCOPY TUBING (MISCELLANEOUS) ×2
SET ARTHROSCOPY TUBING LN (MISCELLANEOUS) ×1 IMPLANT
SPONGE LAP 4X18 RFD (DISPOSABLE) ×3 IMPLANT
SUT ETHILON 2 0 PS N (SUTURE) IMPLANT
TOWEL OR 17X26 10 PK STRL BLUE (TOWEL DISPOSABLE) ×3 IMPLANT
WATER STERILE IRR 1000ML POUR (IV SOLUTION) ×3 IMPLANT

## 2018-02-05 NOTE — Plan of Care (Signed)
Patient admitted to unit from PACU s/p right knee scope with menisectomy.  No complaints of pain or n/v.  Patient tolerating po.  Possible discharge tomorrow pending no acute events overnight.  Fall precautions in place.  Call bell within reach. Problem: Education: Goal: Knowledge of General Education information will improve Description Including pain rating scale, medication(s)/side effects and non-pharmacologic comfort measures Outcome: Progressing   Problem: Health Behavior/Discharge Planning: Goal: Ability to manage health-related needs will improve Outcome: Progressing   Problem: Clinical Measurements: Goal: Ability to maintain clinical measurements within normal limits will improve Outcome: Progressing Goal: Will remain free from infection Outcome: Progressing Goal: Diagnostic test results will improve Outcome: Progressing Goal: Respiratory complications will improve Outcome: Progressing Goal: Cardiovascular complication will be avoided Outcome: Progressing   Problem: Nutrition: Goal: Adequate nutrition will be maintained Outcome: Progressing   Problem: Activity: Goal: Risk for activity intolerance will decrease Outcome: Progressing   Problem: Coping: Goal: Level of anxiety will decrease Outcome: Progressing   Problem: Elimination: Goal: Will not experience complications related to bowel motility Outcome: Progressing Goal: Will not experience complications related to urinary retention Outcome: Progressing   Problem: Pain Managment: Goal: General experience of comfort will improve Outcome: Progressing   Problem: Safety: Goal: Ability to remain free from injury will improve Outcome: Progressing   Problem: Skin Integrity: Goal: Risk for impaired skin integrity will decrease Outcome: Progressing

## 2018-02-05 NOTE — Progress Notes (Signed)
Text paged HC,PA patient has no post-op fluids ordered. Waiting on return call.

## 2018-02-05 NOTE — Interval H&P Note (Signed)
History and Physical Interval Note:  02/05/2018 9:31 AM  Sabrina Crawford  has presented today for surgery, with the diagnosis of CHONDROMALACIA PATELLAE,LEFT KNEE,TEAR OF MEDIAL MENISCUS  The various methods of treatment have been discussed with the patient and family. After consideration of risks, benefits and other options for treatment, the patient has consented to  Procedure(s): KNEE ARTHROSCOPY WITH MEDIAL MENISECTOMY POSSIBLE LATERAL MENISECTOMY (Left) CHONDROPLASTY (Left) as a surgical intervention .  The patient's history has been reviewed, patient examined, no change in status, stable for surgery.  I have reviewed the patient's chart and labs.  Questions were answered to the patient's satisfaction.     Sheral Apleyimothy D Jaxson Keener

## 2018-02-05 NOTE — Anesthesia Procedure Notes (Signed)
Date/Time: 02/05/2018 11:00 AM Performed by: Vanessa Durhamochran, Alexie Samson Glenn, CRNA Oxygen Delivery Method: Simple face mask

## 2018-02-05 NOTE — Transfer of Care (Signed)
Immediate Anesthesia Transfer of Care Note  Patient: Fanny BienSusanne Sanderlin  Procedure(s) Performed: KNEE ARTHROSCOPY WITH MEDIAL MENISECTOMY  LATERAL MENISECTOMY (Left Knee) CHONDROPLASTY (Left Knee)  Patient Location: PACU  Anesthesia Type:Spinal  Level of Consciousness: awake, alert , oriented and patient cooperative  Airway & Oxygen Therapy: Patient Spontanous Breathing and Patient connected to face mask  Post-op Assessment: Report given to RN and Post -op Vital signs reviewed and stable  Post vital signs: Reviewed and stable  Last Vitals:  Vitals Value Taken Time  BP 118/78 02/05/2018 11:52 AM  Temp    Pulse 57 02/05/2018 11:55 AM  Resp 12 02/05/2018 11:55 AM  SpO2 100 % 02/05/2018 11:55 AM  Vitals shown include unvalidated device data.  Last Pain: There were no vitals filed for this visit.    Patients Stated Pain Goal: 3 (02/05/18 [id number])  Complications: No apparent anesthesia complications

## 2018-02-06 ENCOUNTER — Encounter (HOSPITAL_COMMUNITY): Payer: Self-pay | Admitting: Orthopedic Surgery

## 2018-02-06 DIAGNOSIS — S83232A Complex tear of medial meniscus, current injury, left knee, initial encounter: Secondary | ICD-10-CM | POA: Diagnosis not present

## 2018-02-06 MED ORDER — HYDROCODONE-ACETAMINOPHEN 5-325 MG PO TABS: 1.0000 | ORAL_TABLET | Freq: Four times a day (QID) | ORAL | 0 refills | Status: AC | PRN

## 2018-02-06 NOTE — Care Management Note (Signed)
Case Management Note  Patient Details  Name: Sabrina Crawford MRN: [id number] Date of Birth: 06/16/1955  Subjective/Objective:        Spoke with patient at bedside. States she lives at home alone, has aide 3 days per week for 2-3 hr. She has a walker and shower stool, requests a 3n1. She uses [employer] for transportation, has a PCP that she has seen recently. She manages her own meds, uses a pill box and reminders on her phone. She goes to [facility].   Action/Plan: Contacted [facility] to deliver 3n1 to room.  Expected Discharge Date:  02/06/18               Expected Discharge Plan:  Home/Self Care  In-House Referral:  NA  Discharge planning Services  CM Consult  Post Acute Care Choice:  Durable Medical Equipment Choice offered to:  Patient  DME Arranged:  3-N-1 DME Agency:  [facility]  HH Arranged:  NA HH Agency:  NA  Status of Service:  Completed, signed off  If discussed at Long Length of Stay Meetings, dates discussed:    Additional Comments:  Alexis Goodelleele, Little Bashore K, RN 02/06/2018, 9:53 AM

## 2018-02-06 NOTE — Anesthesia Postprocedure Evaluation (Signed)
Anesthesia Post Note  Patient: Sabrina Crawford  Procedure(s) Performed: KNEE ARTHROSCOPY WITH MEDIAL MENISECTOMY  LATERAL MENISECTOMY (Left Knee) CHONDROPLASTY (Left Knee)     Patient location during evaluation: PACU Anesthesia Type: Spinal Level of consciousness: oriented and awake and alert Pain management: pain level controlled Vital Signs Assessment: post-procedure vital signs reviewed and stable Respiratory status: spontaneous breathing, respiratory function stable and patient connected to nasal cannula oxygen Cardiovascular status: blood pressure returned to baseline and stable Postop Assessment: no headache, no backache and no apparent nausea or vomiting Anesthetic complications: no    Last Vitals:  Vitals:   02/06/18 0238 02/06/18 0657  BP: 116/63 120/68  Pulse: (!) 56 (!) 52  Resp: 16 16  Temp: 36.5 C 36.6 C  SpO2: 97% 93%    Last Pain:  Vitals:   02/06/18 0657  TempSrc: Oral  PainSc:                  [name] S

## 2018-02-06 NOTE — Discharge Summary (Signed)
Physician Discharge Summary  Patient ID: Sabrina BienSusanne Adderly MRN: [id number] DOB/AGE: 63/11/1955 63 y.o.  Admit date: 02/05/2018 Discharge date: 02/06/2018  Admission Diagnoses:  <principal problem not specified>  Discharge Diagnoses:  Active Problems:   Acute medial meniscal tear   Past Medical History:  Diagnosis Date  . Acute anaphylaxis    To contrast dye  . Anxiety   . Arthritis   . Asthma   . Blood transfusion without reported diagnosis   . Dyspnea    at times  . Family history of adverse reaction to anesthesia   . GERD (gastroesophageal reflux disease)   . Parkinson's disease (HCC)   . Pineal gland cyst   . PONV (postoperative nausea and vomiting)   . Pre-diabetes    diet controlled  . Takotsubo cardiomyopathy    a. 11/2017: NSTEMI in the setting of allergic reaction with cath showing 40-50% LAD stenosis and EF reduced to 25-30%     Surgeries: Procedure(s): KNEE ARTHROSCOPY WITH MEDIAL MENISECTOMY  LATERAL MENISECTOMY CHONDROPLASTY on 02/05/2018   Consultants (if any):   Discharged Condition: Improved  Hospital Course: Sabrina Crawford is an 62 y.o. female who was admitted 02/05/2018 with a diagnosis of <principal problem not specified> and went to the operating room on 02/05/2018 and underwent the above named procedures.    She was given perioperative antibiotics:  Anti-infectives (From admission, onward)   Start     Dose/Rate Route Frequency Ordered Stop   02/05/18 1700  ceFAZolin (ANCEF) IVPB 2g/100 mL premix     2 g 200 mL/hr over 30 Minutes Intravenous Every 6 hours 02/05/18 1405 02/06/18 0703   02/05/18 0830  ceFAZolin (ANCEF) IVPB 2g/100 mL premix     2 g 200 mL/hr over 30 Minutes Intravenous On call to O.R. 02/05/18 0822 02/05/18 1110   02/05/18 0825  ceFAZolin (ANCEF) 2-4 GM/100ML-% IVPB    Note to Pharmacy:  Vevelyn RoyalsHarvell, Gwendolyn  : cabinet override      02/05/18 0825 02/05/18 1100    .  She was given sequential compression devices, early ambulation,  and ASA for DVT prophylaxis.  She benefited maximally from the hospital stay and there were no complications.    Recent vital signs:  Vitals:   02/06/18 0657 02/06/18 0908  BP: 120/68 119/60  Pulse: (!) 52 62  Resp: 16 16  Temp: 97.8 F (36.6 C) 97.9 F (36.6 C)  SpO2: 93% 96%    Recent laboratory studies:  Lab Results  Component Value Date   HGB 12.6 02/01/2018   HGB 11.6 (L) 12/17/2017   HGB 12.2 12/07/2017   Lab Results  Component Value Date   WBC 7.1 02/01/2018   PLT 216 02/01/2018   Lab Results  Component Value Date   INR 0.99 12/06/2017   Lab Results  Component Value Date   NA 140 02/01/2018   K 3.8 02/01/2018   CL 104 02/01/2018   CO2 30 02/01/2018   BUN 17 02/01/2018   CREATININE 0.58 02/01/2018   GLUCOSE 136 (H) 02/01/2018    Discharge Medications:   Allergies as of 02/06/2018      Reactions   Chamomile Shortness Of Breath   tea   Gadolinium Derivatives Anaphylaxis   12/06/17 After receiving Multihance: severe lip swelling, redness, laryngeal edema; to ED via EMS Shellfish   Iodinated Diagnostic Agents Anaphylaxis   IV contrast   Latex Anaphylaxis   Methocarbamol Shortness Of Breath      Medication List    TAKE these medications  acetaminophen 500 MG tablet Commonly known as:  TYLENOL Take 500 mg by mouth daily as needed for mild pain.   aspirin 81 MG EC tablet Take 1 tablet (81 mg total) by mouth daily.   atorvastatin 20 MG tablet Commonly known as:  LIPITOR Take 1 tablet (20 mg total) by mouth daily at 6 PM.   carbidopa-levodopa 25-100 MG tablet Commonly known as:  SINEMET IR Take 1 tablet by mouth 5 (five) times daily.   Carbidopa-Levodopa ER 25-100 MG tablet controlled release Commonly known as:  SINEMET CR Take 1 tablet by mouth 2 (two) times daily.   carvedilol 3.125 MG tablet Commonly known as:  COREG Take 1 tablet (3.125 mg total) by mouth 2 (two) times daily with a meal.   esomeprazole 40 MG capsule Commonly known  as:  NEXIUM Take 40 mg by mouth every morning.   furosemide 20 MG tablet Commonly known as:  LASIX Take 1 tablet (20 mg total) by mouth daily as needed. For fluid or weight gain greater than 2 pounds overnight. What changed:    reasons to take this  additional instructions   HM MULTIVITAMIN ADULT GUMMY PO Take 2 each by mouth daily at 6 (six) AM.   HYDROcodone-acetaminophen 5-325 MG tablet Commonly known as:  NORCO/VICODIN Take 1 tablet by mouth every 6 (six) hours as needed.   Melatonin 5 MG Tabs Take 5 mg by mouth at bedtime.   omega-3 acid ethyl esters 1 g capsule Commonly known as:  LOVAZA Take 1 g by mouth daily at 2 PM.   PROAIR HFA 108 (90 Base) MCG/ACT inhaler Generic drug:  albuterol Inhale 1 puff into the lungs every 4 (four) hours as needed for shortness of breath or wheezing.   albuterol 0.63 MG/3ML nebulizer solution Commonly known as:  ACCUNEB Take 3 mLs by nebulization daily as needed for shortness of breath.   TURMERIC PO Take 1 capsule by mouth daily.   valsartan 40 MG tablet Commonly known as:  DIOVAN Take 0.5 tablets (20 mg total) by mouth at bedtime.   venlafaxine XR 75 MG 24 hr capsule Commonly known as:  EFFEXOR-XR Take 75 mg by mouth daily.   vitamin E 1000 UNIT capsule Generic drug:  vitamin E Take 1,000 Units by mouth 3 (three) times a week.            Durable Medical Equipment  (From admission, onward)         Start     Ordered   02/06/18 0953  For home use only DME 3 n 1  Once     02/06/18 0953          Diagnostic Studies: No results found.  Disposition: Discharge disposition: 01-Home or Self Care       Discharge Instructions    Discharge patient   Complete by:  As directed    After voiding   Discharge disposition:  01-Home or Self Care   Discharge patient date:  02/06/2018         Signed: Sheral Apley 02/06/2018, 1:53 PM

## 2018-02-06 NOTE — Op Note (Signed)
02/05/2018  1:54 PM  PATIENT:  Sabrina Crawford    PRE-OPERATIVE DIAGNOSIS:  CHONDROMALACIA PATELLAE,LEFT KNEE,TEAR OF MEDIAL MENISCUS  POST-OPERATIVE DIAGNOSIS:  Same  PROCEDURE:  KNEE ARTHROSCOPY WITH MEDIAL MENISECTOMY  LATERAL MENISECTOMY, CHONDROPLASTY  SURGEON:  Sheral Apleyimothy D Qamar Rosman, MD  ASSISTANT: none  ANESTHESIA:   General  BLOOD LOSS: min  COMPLICATIONS: None   PREOPERATIVE INDICATIONS:  Sabrina BienSusanne Gasparro is a  62 y.o. female with a diagnosis of CHONDROMALACIA PATELLAE,LEFT KNEE,TEAR OF MEDIAL MENISCUS who failed conservative measures and elected for surgical management.    The risks benefits and alternatives were discussed with the patient preoperatively including but not limited to the risks of infection, bleeding, nerve injury, cardiopulmonary complications, the need for revision surgery, among others, and the patient was willing to proceed.  OPERATIVE IMPLANTS: nonoe  OPERATIVE FINDINGS: Examination under anesthesia: stable Diagnostic Arthroscopy:  articular cartilage:PF and MFC grade 3 Medial meniscus:complex tear Lateral meniscus:small radial tear Anterior cruciate ligament/PCL: stable Loose bodies: none    OPERATIVE PROCEDURE:  Patient was identified in the preoperative holding area and site was marked by me female was transported to the operating theater and placed on the table in supine position taking care to pad all bony prominences. After a preincinduction time out anesthesia was induced.  female received ancef for preoperative antibiotics. The left lower extremity was prepped and draped in normal sterile fashion and a pre-incision timeout was performed.   A small stab incision was made in the anterolateral portal position. The arthroscope was introduced in the joint. A medial portal was then established under direct visualization just above the anterior horn of the medial meniscus. Diagnostic arthroscopy was then carried out with findings as described  above.   I performed a chondroplasty of her patellofemoral space using a shaver.  Next in the lateral compartment I performed a small partial meniscectomy of her radial meniscal tear.  She had a large amount of stable remaining meniscus.  I turned my attention to the medial joint space where she had a complex posterior horn and mid body meniscus tear I debrided this with a shaver and biter.  I performed a chondroplasty of her medial femoral condyle using a shaver.    The arthroscopic equipment was removed from the joint and the portals were closed with 3-0 nylon in an interrupted fashion. The knee was infiltrated with 80mg  depomedrol.  Sterile dressings were then applied including Xeroform 4 x 4's ABDs an ACE bandage.  The patient was then allowed to awaken from general anesthesia, transferred to the stretcher and taken to the recovery room in stable condition.  POSTOPERATIVE PLAN: The patient will be discharged home today and will followup in one week for suture removal and wound check.  VTE prophylaxis: ambulation, ASA

## 2018-02-06 NOTE — Discharge Instructions (Signed)
Remove dressings on Friday and ok to shower then with bandaids on incisions Bear weight as tolerated Continue Aspirin daily for dvt prophylaxis

## 2018-02-28 ENCOUNTER — Other Ambulatory Visit: Payer: Self-pay

## 2018-02-28 ENCOUNTER — Ambulatory Visit (HOSPITAL_COMMUNITY): Payer: Medicaid Other | Attending: Cardiology

## 2018-02-28 DIAGNOSIS — I5181 Takotsubo syndrome: Secondary | ICD-10-CM | POA: Diagnosis present

## 2018-02-28 DIAGNOSIS — I252 Old myocardial infarction: Secondary | ICD-10-CM | POA: Diagnosis not present

## 2018-03-01 ENCOUNTER — Telehealth: Payer: Self-pay

## 2018-03-01 NOTE — Telephone Encounter (Signed)
Called patient, LVM of results, patient was given call back number.

## 2018-03-01 NOTE — Telephone Encounter (Signed)
-----   Message from Marcelino DusterAngela Nicole Duke, [place] sent at 03/01/2018  8:11 AM EDT ----- Your squeeze function is now normal. Please continue all medications.

## 2018-03-19 ENCOUNTER — Ambulatory Visit: Payer: Medicaid Other | Admitting: Cardiology

## 2018-04-19 ENCOUNTER — Other Ambulatory Visit: Payer: Self-pay

## 2018-04-19 ENCOUNTER — Emergency Department (HOSPITAL_COMMUNITY): Payer: Self-pay

## 2018-04-19 ENCOUNTER — Encounter (HOSPITAL_COMMUNITY): Payer: Self-pay | Admitting: Obstetrics and Gynecology

## 2018-04-19 ENCOUNTER — Emergency Department (HOSPITAL_COMMUNITY)
Admission: EM | Admit: 2018-04-19 | Discharge: 2018-04-19 | Disposition: A | Discharge status: Home / Self Care | Payer: Self-pay | Attending: Emergency Medicine | Admitting: Emergency Medicine

## 2018-04-19 DIAGNOSIS — Z79899 Other long term (current) drug therapy: Secondary | ICD-10-CM | POA: Insufficient documentation

## 2018-04-19 DIAGNOSIS — Z87891 Personal history of nicotine dependence: Secondary | ICD-10-CM | POA: Insufficient documentation

## 2018-04-19 DIAGNOSIS — M25562 Pain in left knee: Secondary | ICD-10-CM | POA: Insufficient documentation

## 2018-04-19 DIAGNOSIS — W19XXXA Unspecified fall, initial encounter: Secondary | ICD-10-CM

## 2018-04-19 DIAGNOSIS — E119 Type 2 diabetes mellitus without complications: Secondary | ICD-10-CM | POA: Insufficient documentation

## 2018-04-19 DIAGNOSIS — R42 Dizziness and giddiness: Secondary | ICD-10-CM | POA: Insufficient documentation

## 2018-04-19 DIAGNOSIS — G20 Parkinson's disease: Secondary | ICD-10-CM | POA: Insufficient documentation

## 2018-04-19 DIAGNOSIS — Z7982 Long term (current) use of aspirin: Secondary | ICD-10-CM | POA: Insufficient documentation

## 2018-04-19 DIAGNOSIS — J45909 Unspecified asthma, uncomplicated: Secondary | ICD-10-CM | POA: Insufficient documentation

## 2018-04-19 LAB — CBC WITH DIFFERENTIAL/PLATELET
ABS IMMATURE GRANULOCYTES: 0.01 10*3/uL (ref 0.00–0.07)
BASOS PCT: 1 %
Basophils Absolute: 0 10*3/uL (ref 0.0–0.1)
EOS ABS: 0.2 10*3/uL (ref 0.0–0.5)
Eosinophils Relative: 3 %
HEMATOCRIT: 39.2 % (ref 36.0–46.0)
Hemoglobin: 12.3 g/dL (ref 12.0–15.0)
Immature Granulocytes: 0 %
LYMPHS ABS: 2.3 10*3/uL (ref 0.7–4.0)
Lymphocytes Relative: 40 %
MCH: 29.9 pg (ref 26.0–34.0)
MCHC: 31.4 g/dL (ref 30.0–36.0)
MCV: 95.1 fL (ref 80.0–100.0)
MONO ABS: 0.5 10*3/uL (ref 0.1–1.0)
MONOS PCT: 8 %
NEUTROS ABS: 2.8 10*3/uL (ref 1.7–7.7)
Neutrophils Relative %: 48 %
PLATELETS: 206 10*3/uL (ref 150–400)
RBC: 4.12 MIL/uL (ref 3.87–5.11)
RDW: 12.8 % (ref 11.5–15.5)
WBC: 5.8 10*3/uL (ref 4.0–10.5)
nRBC: 0 % (ref 0.0–0.2)

## 2018-04-19 LAB — URINALYSIS, ROUTINE W REFLEX MICROSCOPIC
BILIRUBIN URINE: NEGATIVE
Glucose, UA: NEGATIVE mg/dL
Hgb urine dipstick: NEGATIVE
KETONES UR: 5 mg/dL — AB
Leukocytes, UA: NEGATIVE
NITRITE: NEGATIVE
PROTEIN: NEGATIVE mg/dL
Specific Gravity, Urine: 1.011 (ref 1.005–1.030)
pH: 6 (ref 5.0–8.0)

## 2018-04-19 LAB — I-STAT TROPONIN, ED: TROPONIN I, POC: 0.01 ng/mL (ref 0.00–0.08)

## 2018-04-19 LAB — BASIC METABOLIC PANEL
Anion gap: 8 (ref 5–15)
BUN: 16 mg/dL (ref 8–23)
CALCIUM: 9 mg/dL (ref 8.9–10.3)
CO2: 30 mmol/L (ref 22–32)
CREATININE: 0.53 mg/dL (ref 0.44–1.00)
Chloride: 102 mmol/L (ref 98–111)
GFR calc non Af Amer: >60 mL/min (ref >60)
Glucose, Bld: 170 mg/dL — ABNORMAL HIGH (ref 70–99)
Potassium: 3.6 mmol/L (ref 3.5–5.1)
SODIUM: 140 mmol/L (ref 135–145)

## 2018-04-19 MED ORDER — FENTANYL CITRATE (PF) 100 MCG/2ML IJ SOLN
50.0000 ug | Freq: Once | INTRAMUSCULAR | Status: AC
  Administered 2018-04-19: 50 ug via INTRAVENOUS
  Filled 2018-04-19: qty 2

## 2018-04-19 NOTE — ED Notes (Signed)
Bed: WA09 Expected date:  Expected time:  Means of arrival:  Comments: Ems- fall 

## 2018-04-19 NOTE — ED Triage Notes (Signed)
Per EMS: Pt is coming from home. Pt had a ground level fall and dizziness following the fall. Pt has a hx of parkinson's and falls daily. Pt is complaining of pain on her right side and hip. Pt lives at home alone and is concerned about her safety.

## 2018-04-19 NOTE — ED Notes (Signed)
Pt states improvement in symptoms, pt ambulating with assistance with walker.  Gait unsteady.  Wheelchair utilized at [employer].  Prescription provided to pt for wheelchair as requested by pt.   Pt to go home with daughter.

## 2018-04-19 NOTE — Discharge Instructions (Signed)
You can take 1000 mg of Tylenol.  Do not exceed 4000 mg of Tylenol a day.  Follow the RICE (Rest, Ice, Compression, Elevation) protocol as directed.   As we discussed, I provided prescription for a wheelchair.  [facility] on [address] to fill it.  Return emergency department for any worsening pain, difficulty in bleeding, chest pain, difficulty breathing or any other worsening or concerning symptoms.

## 2018-04-19 NOTE — ED Notes (Signed)
Patient transported to X-ray 

## 2018-04-19 NOTE — ED Notes (Signed)
Pt able to ambulate, but is very unsteady and complains of pain 8/10

## 2018-04-19 NOTE — ED Notes (Signed)
Pt returned from radiology.

## 2018-04-19 NOTE — ED Notes (Addendum)
Per pt she lives with her daughter and son in law but was going today to look for a place to live with assistance. Pt doesn't have insurance so cant qualify for assistance but reports that has insurance that will be effective come January.     Denies LOC or taking blood thinners.

## 2018-04-19 NOTE — ED Notes (Signed)
Bed: [facility] Expected date:  Expected time:  Means of arrival:  Comments: No bed 

## 2018-04-19 NOTE — ED Provider Notes (Signed)
[facility]-EMERGENCY DEPT Provider Note   CSN: [id number] Arrival date & time: 04/19/18  1621     History   Chief Complaint Chief Complaint  Patient presents with  . Fall  . Hip Pain  . Dizziness    HPI Sabrina Crawford is a 62 y.o. female history of anxiety, asthma, GERD, Parkinson's disease, prediabetes who presents for evaluation of fall, right hip pain, right knee pain.  Patient reports that she was walking outside of her door and states that she thinks she lost her balance which caused her to fall.  She states she hit her right hip and her left knee.  Additionally, she thinks she may have lost consciousness for a few seconds but cannot recall.  She states she is not currently on any blood thinners.  Patient states that after she fell, she felt dizzy but she cannot describe this dizziness.  She states she just "felt disoriented."  She states that she normally gets dizziness with the Parkinson's that she did not know if it was related to that.  Patient denies any preceding chest pain.  She reports she has not been able to ambulate since the incident.  Patient denies any recent fevers, vision changes, neck pain, abdominal pain, nausea/vomiting, urinary complaints.  The history is provided by the patient.    Past Medical History:  Diagnosis Date  . Acute anaphylaxis    To contrast dye  . Anxiety   . Arthritis   . Asthma   . Blood transfusion without reported diagnosis   . Dyspnea    at times  . Family history of adverse reaction to anesthesia   . GERD (gastroesophageal reflux disease)   . Parkinson's disease (HCC)   . Pineal gland cyst   . PONV (postoperative nausea and vomiting)   . Pre-diabetes    diet controlled  . Takotsubo cardiomyopathy    a. 11/2017: NSTEMI in the setting of allergic reaction with cath showing 40-50% LAD stenosis and EF reduced to 25-30%     Patient Active Problem List   Diagnosis Date Noted  . Acute medial meniscal tear  02/05/2018  . Pre-operative cardiovascular examination 01/28/2018  . Allergy to imaging contrast media 12/17/2017  . Abdominal pain 12/17/2017  . Chest pain 12/06/2017  . Elevated troponin I level 12/06/2017  . Takotsubo syndrome 12/06/2017  . Non-insulin dependent type 2 diabetes mellitus (HCC) 11/23/2017  . Falls frequently 11/23/2017  . 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 Parkinsons (HCC) 11/23/2017    Past Surgical History:  Procedure Laterality Date  . ABDOMINAL HYSTERECTOMY    . APPENDECTOMY    . bladder tack    . BREAST SURGERY     implant left side, reduction in right side  . BUNIONECTOMY     bil  . CHOLECYSTECTOMY    . CHONDROPLASTY Left 02/05/2018   Procedure: CHONDROPLASTY;  Surgeon: Sheral Apley, MD;  Location: [facility]S;  Service: Orthopedics;  Laterality: Left;  . knee arthroscopy Left knee     and bakers cyst Dr. Eulah Pont 02-05-18  . KNEE ARTHROSCOPY WITH MEDIAL MENISECTOMY Left 02/05/2018   Procedure: KNEE ARTHROSCOPY WITH MEDIAL MENISECTOMY  LATERAL MENISECTOMY;  Surgeon: Sheral Apley, MD;  Location: [facility];  Service: Orthopedics;  Laterality: Left;  . LEFT HEART CATH AND CORONARY ANGIOGRAPHY N/A 12/06/2017   Procedure: LEFT HEART CATH AND CORONARY ANGIOGRAPHY;  Surgeon: Lyn Records, MD;  Location: [facility]ASIVE CV [facility];  Service: Cardiovascular;  Laterality: N/A;  . meniscus  repair right knee    . thyroid nodule removed     calcium related     OB History   None      Home Medications    Prior to Admission medications   Medication Sig Start Date End Date Taking? Authorizing Provider  albuterol (ACCUNEB) 0.63 MG/3ML nebulizer solution Take 3 mLs by nebulization daily as needed for shortness of breath. 12/15/17  Yes [provider]  aspirin EC 81 MG EC tablet Take 1 tablet (81 mg total) by mouth daily. 12/09/17  Yes Strader, [place] M, PA-C  atorvastatin (LIPITOR) 20 MG tablet Take 1 tablet (20 mg total) by mouth daily at 6 PM. 12/08/17  Yes Strader,  [place] M, PA-C  carbidopa-levodopa (SINEMET IR) 25-100 MG tablet Take 1 tablet by mouth 5 (five) times daily. Take 1 tablet at 6am,10am, 2pm, 6pm, 10pm 11/12/17  Yes [provider]  Carbidopa-Levodopa ER (SINEMET CR) 25-100 MG tablet controlled release Take 1 tablet by mouth 2 (two) times daily. 11/23/17  Yes [provider]  carvedilol (COREG) 3.125 MG tablet Take 1 tablet (3.125 mg total) by mouth 2 (two) times daily with a meal. 12/08/17  Yes Strader, [place] M, PA-C  diclofenac sodium (VOLTAREN) 1 % GEL Apply 2 g topically 4 (four) times daily as needed (pain).  03/22/18  Yes [provider]  esomeprazole (NEXIUM) 40 MG capsule Take 40 mg by mouth every morning. 11/17/17  Yes [provider]  magnesium 30 MG tablet Take 30 mg by mouth at bedtime.   Yes [provider]  Melatonin 5 MG TABS Take 5 mg by mouth at bedtime.   Yes [provider]  Multiple Vitamins-Minerals (HM MULTIVITAMIN ADULT GUMMY PO) Take 2 tablets by mouth daily at 6 (six) AM.    Yes [provider]  venlafaxine XR (EFFEXOR-XR) 75 MG 24 hr capsule Take 75 mg by mouth daily.  11/23/17  Yes [provider]  furosemide (LASIX) 20 MG tablet Take 1 tablet (20 mg total) by mouth daily as needed. For fluid or weight gain greater than 2 pounds overnight. Patient taking differently: Take 20 mg by mouth daily as needed for fluid or edema. For fluid or weight gain greater than 2 pounds overnight 12/08/17 12/08/18  Strader, Lennart Pall, PA-C  HYDROcodone-acetaminophen (NORCO) 5-325 MG tablet Take 1 tablet by mouth every 6 (six) hours as needed. Patient not taking: Reported on 04/19/2018 02/06/18   Sheral Apley, MD  [facility] HFA 108 [phone] Base) MCG/ACT inhaler Inhale 1 puff into the lungs every 4 (four) hours as needed for shortness of breath or wheezing. 11/12/17   [provider]  valsartan (DIOVAN) 40 MG tablet Take 0.5 tablets (20 mg total) by mouth at  bedtime. Patient not taking: Reported on 02/01/2018 12/17/17   Abelino Derrick, PA-C    Family History Family History  Problem Relation Age of Onset  . Heart failure Mother   . Heart failure Father     Social History Social History   Tobacco Use  . Smoking status: Former Smoker    Last attempt to quit: 2000    Years since quitting: 19.8  . Smokeless tobacco: Never Used  . Tobacco comment: 1pk/week.   Substance Use Topics  . Alcohol use: Yes    Alcohol/week: 2.0 standard drinks    Types: 2 Glasses of wine per week    Comment: occasionally   . Drug use: Never     Allergies   Chamomile; Gadolinium  derivatives; Iodinated diagnostic agents; Latex; and Methocarbamol   Review of Systems Review of Systems  Constitutional: Negative for fever.  Respiratory: Negative for cough and shortness of breath.   Cardiovascular: Negative for chest pain.  Gastrointestinal: Negative for abdominal pain, nausea and vomiting.  Genitourinary: Negative for dysuria and hematuria.  Musculoskeletal:       Right hip pain Left knee pain  Neurological: Positive for dizziness. Negative for headaches.  All other systems reviewed and are negative.   Physical Exam Updated Vital Signs BP 133/75   Pulse 62   Temp 98 F (36.7 C) (Oral)   Resp 18   SpO2 98%   Physical Exam  Constitutional: She is oriented to person, place, and time. She appears well-developed and well-nourished.  HENT:  Head: Normocephalic and atraumatic.  Mouth/Throat: Oropharynx is clear and moist and mucous membranes are normal.  Eyes: Pupils are equal, round, and reactive to light. Conjunctivae, EOM and lids are normal.  Neck: Full passive range of motion without pain.  Full flexion/extension and lateral movement of neck fully intact. No bony midline tenderness. No deformities or crepitus.   Cardiovascular: Normal rate, regular rhythm, normal heart sounds and normal pulses. Exam reveals no gallop and no friction rub.  No  murmur heard. Pulmonary/Chest: Effort normal and breath sounds normal.  Abdominal: Soft. Normal appearance. There is no tenderness. There is no rigidity and no guarding.  Musculoskeletal: Normal range of motion.       Lumbar back: She exhibits tenderness.  Tenderness palpation noted to anterior aspect of left knee.  No deformity or crepitus noted.  Limited flexion/extension secondary to pain.  Diffuse tenderness palpation of the right hip.  No deformity or crepitus noted.  Limited flexion extension secondary to pain.  No tenderness palpation to right knee, right ankle.  No tenderness palpation of the left hip, left tib-fib, left ankle.  No midline C, T-spine tenderness.  Patient with diffuse tenderness over the entire lumbar region and extends midline.  No deformity or crepitus noted.  Bilateral lower extremities are symmetric in appearance.  No shortening, rotation.  Neurological: She is alert and oriented to person, place, and time.  Cranial nerves III-XII intact Follows commands, Moves all extremities  5/5 strength to BUE and BLE  Sensation intact throughout all major nerve distributions Normal coordination No cogwheel rigidity  No slurred speech. No facial droop.   Skin: Skin is warm and dry. Capillary refill takes less than 2 seconds.  Psychiatric: She has a normal mood and affect. Her speech is normal.  Nursing note and vitals reviewed.    ED Treatments / Results  Labs (all labs ordered are listed, but only abnormal results are displayed) Labs Reviewed  URINALYSIS, ROUTINE W REFLEX MICROSCOPIC - Abnormal; Notable for the following components:      Result Value   Ketones, ur 5 (*)    All other components within normal limits  BASIC METABOLIC PANEL - Abnormal; Notable for the following components:   Glucose, Bld 170 (*)    All other components within normal limits  CBC WITH DIFFERENTIAL/PLATELET  I-STAT TROPONIN, ED    EKG None  Radiology Dg Lumbar Spine Complete  Result  Date: 04/19/2018 CLINICAL DATA:  Fall.  Low back pain. EXAM: LUMBAR SPINE - COMPLETE 4+ VIEW COMPARISON:  CT of the abdomen and pelvis 12/17/2017 FINDINGS: Five non rib-bearing lumbar type vertebral bodies are present. Slight anterolisthesis is again seen at L5-S1. No acute fracture traumatic subluxation is present. Facet degenerative changes  are most evident at L5-S1. Degenerative endplate changes are present at L5-S1. Surgical clips are present at the gallbladder fossa. Aortic atherosclerosis is evident. IMPRESSION: 1. No acute abnormality. 2. Stable degenerative change. 3. Disease is most significant at L5-S1. 4. Aortic atherosclerosis. Electronically Signed   By: Marin Roberts M.D.   On: 04/19/2018 18:22   Ct Head Wo Contrast  Result Date: 04/19/2018 CLINICAL DATA:  Ground level fall.  Dizziness.  Parkinson's disease. EXAM: CT HEAD WITHOUT CONTRAST TECHNIQUE: Contiguous axial images were obtained from the base of the skull through the vertex without intravenous contrast. COMPARISON:  MRI brain 12/06/2017 FINDINGS: Brain: Small subependymal nodule along the right lateral ventricle, image 19/2, no change from 12/06/2017. Small pineal cyst as before. Otherwise, the brainstem, cerebellum, cerebral peduncles, thalami, basal ganglia, basilar cisterns, and ventricular system appear within normal limits. No intracranial hemorrhage, other mass lesion, or acute CVA. Vascular: Unremarkable Skull: Unremarkable Sinuses/Orbits: Unremarkable Other: No supplemental non-categorized findings. IMPRESSION: 1. No acute intracranial findings. 2. Stable pineal cyst and stable small right sub appended 1 nodule in the right lateral ventricle. These do not require follow up. Electronically Signed   By: Gaylyn Rong M.D.   On: 04/19/2018 18:20   Dg Knee Complete 4 Views Left  Result Date: 04/19/2018 CLINICAL DATA:  Fall.  Knee pain. EXAM: LEFT KNEE - COMPLETE 4+ VIEW COMPARISON:  None. FINDINGS: No evidence of  fracture, dislocation, or joint effusion. No evidence of arthropathy or other focal bone abnormality. Soft tissues are unremarkable. IMPRESSION: Negative left knee radiographs. Electronically Signed   By: Marin Roberts M.D.   On: 04/19/2018 18:20   Dg Hip Unilat W Or Wo Pelvis 2-3 Views Right  Result Date: 04/19/2018 CLINICAL DATA:  Fall.  Right hip pain. EXAM: DG HIP (WITH OR WITHOUT PELVIS) 2-3V RIGHT COMPARISON:  None. FINDINGS: There is no evidence of hip fracture or dislocation. There is no evidence of arthropathy or other focal bone abnormality. IMPRESSION: Negative right hip radiographs. Electronically Signed   By: Marin Roberts M.D.   On: 04/19/2018 18:24    Procedures Procedures (including critical care time)  Medications Ordered in ED Medications  fentaNYL (SUBLIMAZE) injection 50 mcg (50 mcg Intravenous Given 04/19/18 1841)     Initial Impression / Assessment and Plan / ED Course  I have reviewed the triage vital signs and the nursing notes.  Pertinent labs & imaging results that were available during my care of the patient were reviewed by me and considered in my medical decision making (see chart for details).     62 y.o. F with PMH/o Parkinson's, anxiety, asthma, GERD presents for evaluation after a fall.  Patient states that she was walking out of her house that she thinks she lost balance and slipped, landing.  She does not know if she lost consciousness.  She states that she did not have any preceding chest pain or dizziness prior to onset of fall but did report a little feeling "disoriented" after the fall.  Denies any room spinning sensation or lightheadedness.  Reports pain to left knee, right hip and lower back.  She is on blood thinners and denies any nausea/vomiting, numbness/weakness. Patient is afebrile, non-toxic appearing, sitting comfortably on examination table. Vital signs reviewed and stable.  On exam, no neuro deficits noted.  She has diffuse  tenderness palpation noted to anterior aspect of left knee.  Patient reports that she previously has history of knee complications with this knee so question of this is acute  or chronic pain.  Additionally, she has some tenderness palpation in the right hip.  She is moving the extremity without any difficulty.  We will plan for x-rays for knee, hip, back.  Additionally, given questionable LOC, will plan for CT head.  At this time, do not suspect CVA as the source of patient's symptoms.  Additionally, low suspicion for infectious or cardiac etiology but given her complaints, will plan for evaluation.  Troponin negative.  BMP unremarkable.  CBC without any significant leukocytosis or anemia.  Hip x-ray negative for any acute bony abnormalities.  Lumbar x-ray negative for any acute bony normalities.  Knee x-ray negative for any acute bony abnormalities.  CT head negative for any acute abnormalities.  Reevaluation.  Patient reports improvement in pain after analgesics.  She states she does not feel dizzy but still feels slightly "foggy." She does report that this occurred after she got fentanyl.  We will plan to ambulate patient.  Patient able to ambulate with the assistance of a walker which is her baseline given her history of Parkinson's.  She was able to bear weight on her bilateral lower extremities.  Do not suspect occult fracture that would need any further imaging.  Additionally, when patient ambulated, she states she did not have any dizziness sensation, chest pain.  Patient reports feeling better.  I discussed with patient.  She is requesting a wheelchair given her mobility issues.  Patient had ample opportunity for questions and discussion. All patient's questions were answered with full understanding. Strict return precautions discussed. Patient expresses understanding and agreement to plan.   Final Clinical Impressions(s) / ED Diagnoses   Final diagnoses:  Fall, initial encounter  Acute pain of  left knee    ED Discharge Orders         Ordered    Wheelchair     04/19/18 2230           Rosana Hoes 04/20/18 0021    Little, Ambrose Finland, MD 04/20/18 2355

## 2018-04-19 NOTE — ED Notes (Signed)
Purwick placed for ease of patient to use restroom

## 2018-11-11 IMAGING — MR MR KNEE*L* W/O CM
7 series · 38 of 40 positions shown · non-contrast
Comparison: Plain films left knee 11/13/2017.

CLINICAL DATA: Left knee pain, swelling, popping and difficulty
weight-bearing. The patient suffered a fall 9 months ago.

EXAM:
MRI OF THE LEFT KNEE WITHOUT CONTRAST
TECHNIQUE: Multiplanar, multisequence MR imaging of the knee was performed. No
intravenous contrast was administered.

[Series 6: T2 fat-sat · axial · left · 4.0mm · 0.50mm/px · z∈[-45,+73]mm · 6 of 28 slices shown (1 of 3)]
[im 1/28]
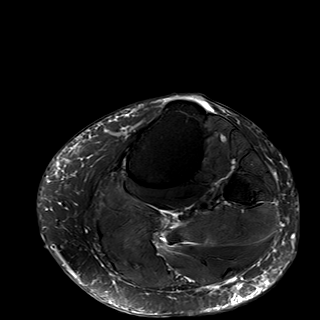
[im 6/28]
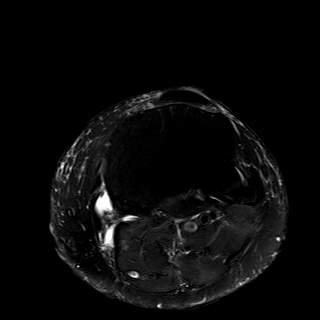
[im 11/28]
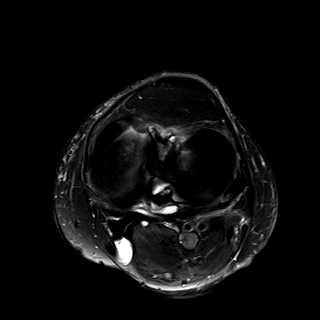
[im 17/28]
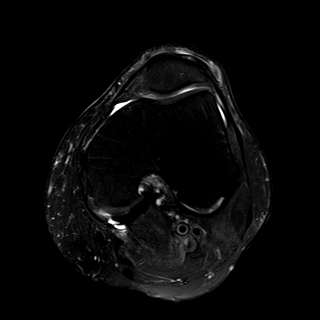
[im 22/28]
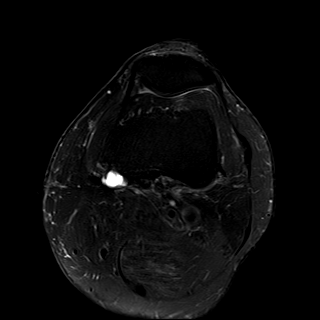
[im 28/28]
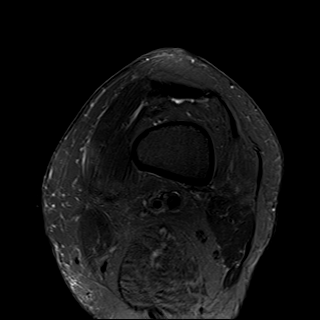

[Series 7: T2 fat-sat · coronal · left · 4.0mm · 0.39mm/px · 6 of 28 slices shown (2 of 3)]
[im 1/28]
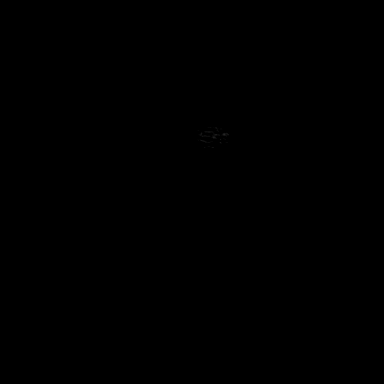
[im 6/28]
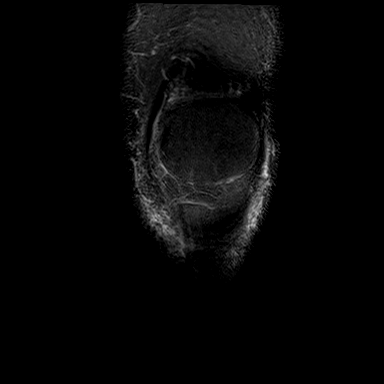
[im 11/28]
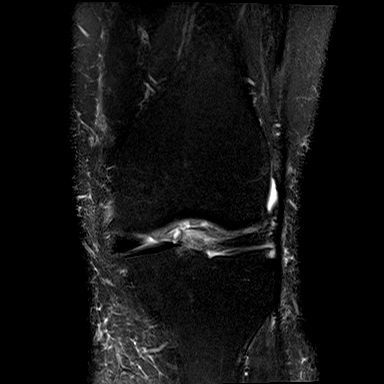
[im 17/28]
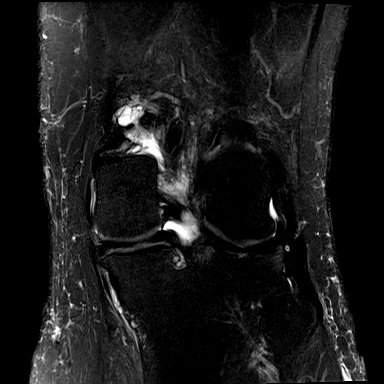
[im 22/28]
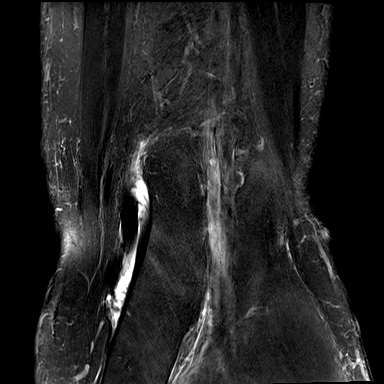
[im 28/28]
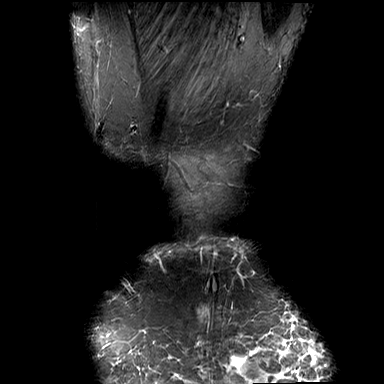

[Series 8: T1 · coronal · left · 4.0mm · 0.39mm/px · 4 of 28 slices shown]
[im 1/28]
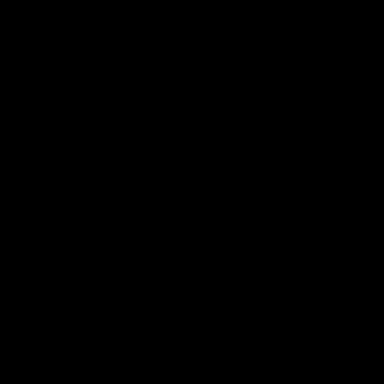
[im 6/28]
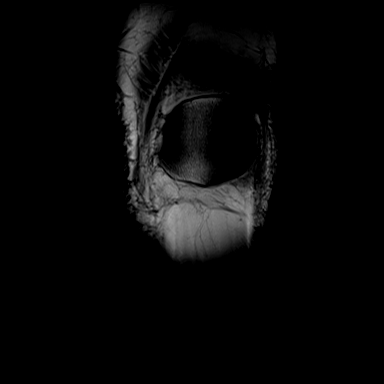
[im 11/28]
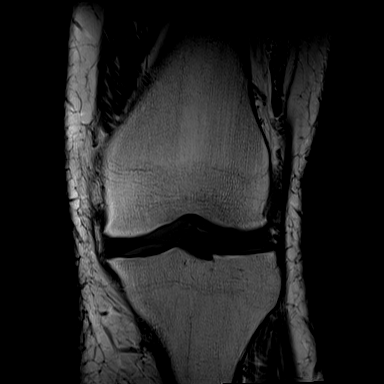
[im 17/28]
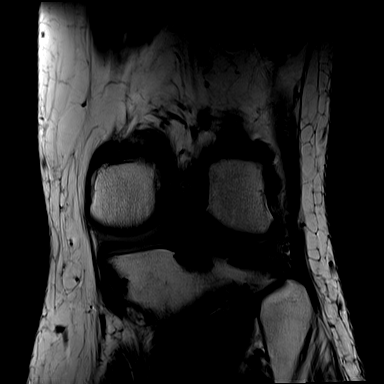

[Series 9: PD fat-sat · coronal · left · 3.0mm · 0.47mm/px · 6 of 28 slices shown (1 of 2)]
[im 1/28]
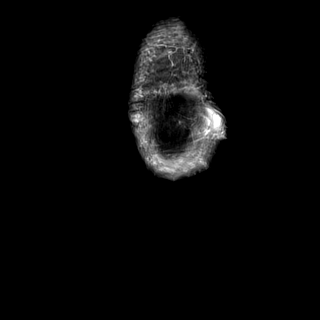
[im 6/28]
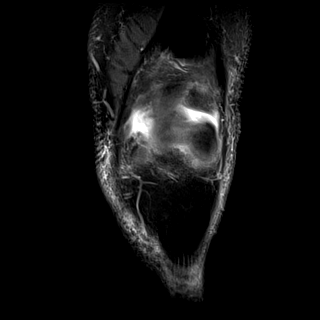
[im 11/28]
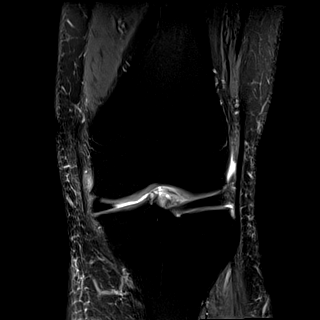
[im 17/28]
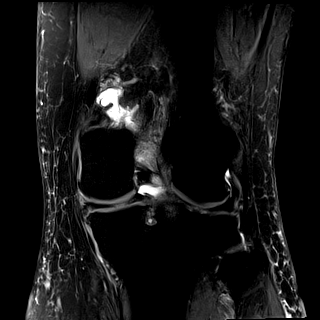
[im 22/28]
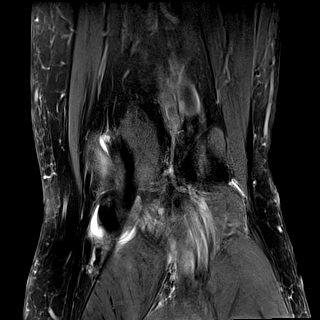
[im 28/28]
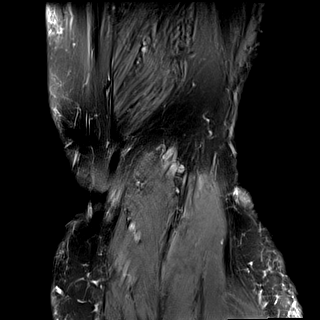

[Series 10: PD fat-sat · sagittal · left · 3.0mm · 0.39mm/px · 6 of 26 slices shown (2 of 2)]
[im 1/26]
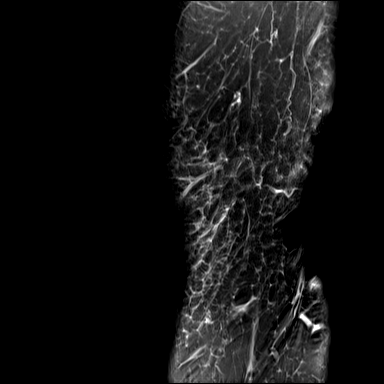
[im 6/26]
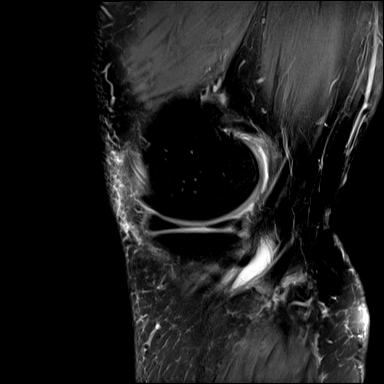
[im 11/26]
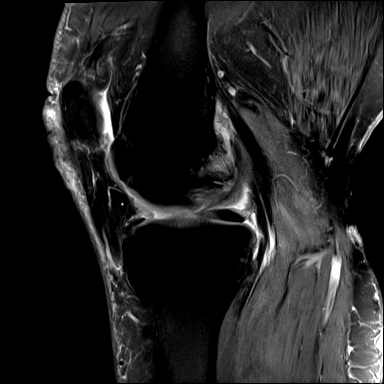
[im 16/26]
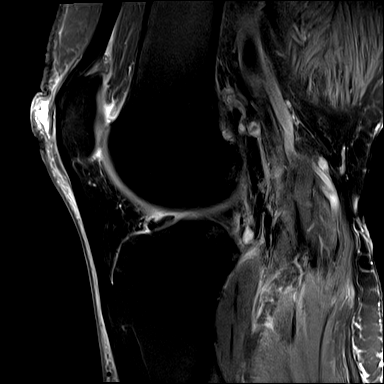
[im 21/26]
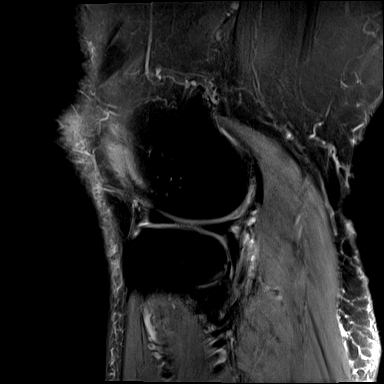
[im 26/26]
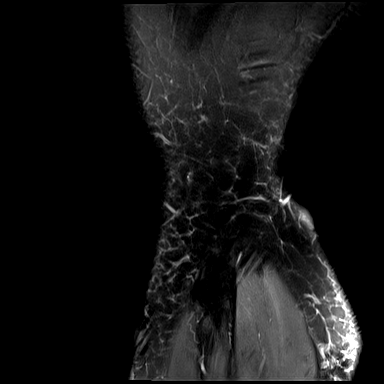

[Series 11: T2 fat-sat · sagittal · left · 3.0mm · 0.39mm/px · 6 of 27 slices shown (3 of 3)]
[im 1/27]
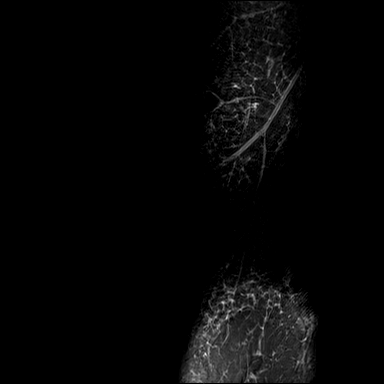
[im 6/27]
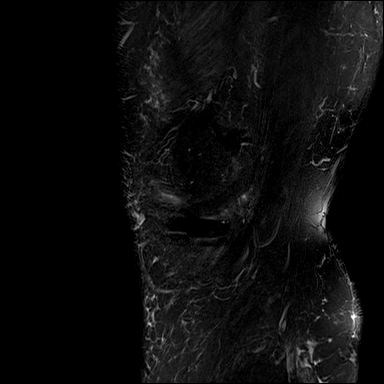
[im 11/27]
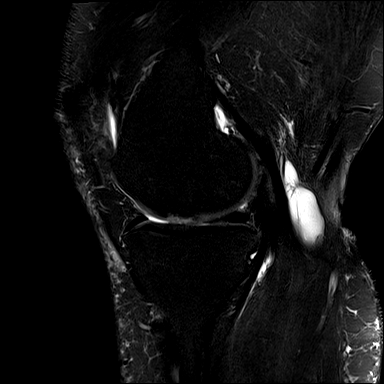
[im 16/27]
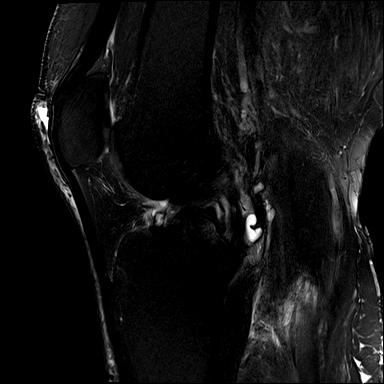
[im 21/27]
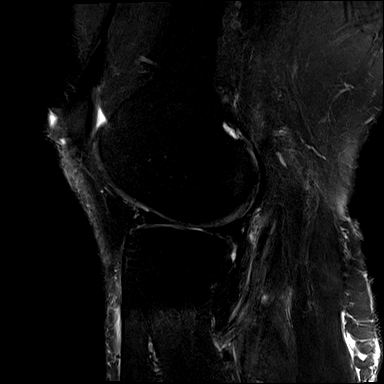
[im 27/27]
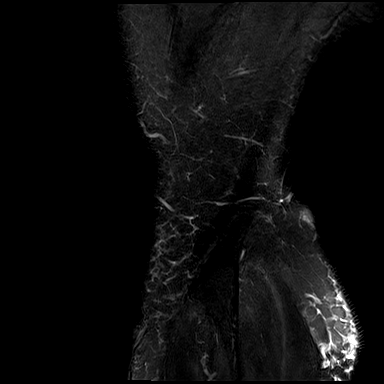

[Series 12: PD · coronal · left · 1.5mm · 0.44mm/px · 4 of 18 slices shown]
[im 1/18]
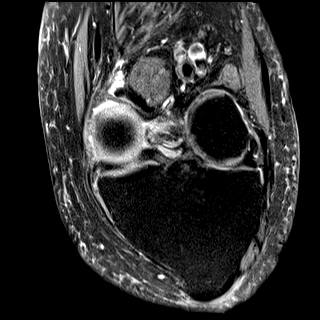
[im 6/18]
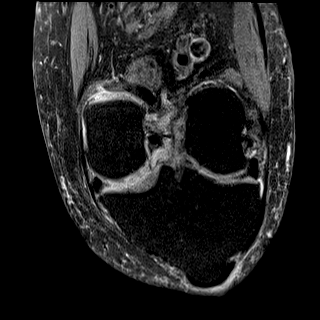
[im 12/18]
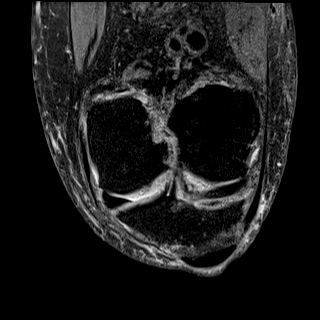
[im 18/18]
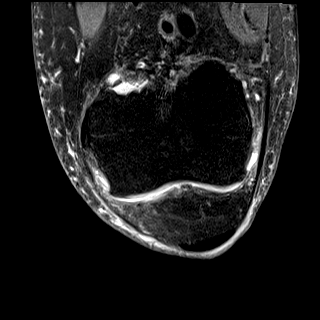

[38 of 40 positions shown; findings below may reference images not displayed]

FINDINGS: MENISCI

Medial meniscus: Horizontal tear in the posterior horn reaching the
meniscal undersurface extends to the junction of the posterior horn
and body. No centrally displaced fragment.

Lateral meniscus: Intact. There is some fraying along the free edge
of the body.

LIGAMENTS

Cruciates: Intact. There is some mucoid degeneration of both
ligaments.

Collaterals:  Intact.

CARTILAGE

Patellofemoral:  Preserved.

Medial: Focus of irregularity is seen along the weight-bearing
medial femoral condyle where fluid undermines a very small flap of
cartilage.

Lateral:  Preserved.

Joint:  Small effusion.

Popliteal Fossa: Baker's cyst measures 1.7 cm AP by 1 cm transverse
by 4.6 cm craniocaudal.

Extensor Mechanism:  Intact.

Bones: No fracture or worrisome lesion. Small osteophytes are seen
about the knee.

Other: Fluid in the prepatellar bursa measures 3.3 cm transverse by
0.6 cm AP x 1.7 cm craniocaudal.
IMPRESSION: Horizontal tear posterior horn medial meniscus.

Focal area cartilage irregularity with fluid undermining a very
small flap of cartilage along the central weight-bearing medial
femoral condyle.

Mild osteoarthritis about the knee.

Prepatellar bursitis.

Small Baker's cyst.

## 2019-11-11 DEATH — deceased
